# Patient Record
Sex: Male | Born: 1960
Health system: Southern US, Community
[De-identification: ages and names within clinical notes are randomized; demographics above are authoritative.]

## PROBLEM LIST (undated history)

## (undated) DIAGNOSIS — I519 Heart disease, unspecified: Secondary | ICD-10-CM

## (undated) DIAGNOSIS — I219 Acute myocardial infarction, unspecified: Secondary | ICD-10-CM

## (undated) DIAGNOSIS — I441 Atrioventricular block, second degree: Secondary | ICD-10-CM

## (undated) DIAGNOSIS — R0789 Other chest pain: Secondary | ICD-10-CM

## (undated) DIAGNOSIS — C801 Malignant (primary) neoplasm, unspecified: Secondary | ICD-10-CM

## (undated) DIAGNOSIS — Z86010 Personal history of colonic polyps: Principal | ICD-10-CM

## (undated) HISTORY — DX: Atrioventricular block, second degree: I44.1

## (undated) HISTORY — DX: Acute myocardial infarction, unspecified: I21.9

## (undated) HISTORY — DX: Other chest pain: R07.89

## (undated) HISTORY — PX: OTHER SURGICAL HISTORY: SHX169

## (undated) HISTORY — DX: Heart disease, unspecified: I51.9

## (undated) HISTORY — DX: Malignant (primary) neoplasm, unspecified: C80.1

## (undated) HISTORY — DX: Personal history of colonic polyps: Z86.010

---

## 1997-09-25 ENCOUNTER — Ambulatory Visit (HOSPITAL_COMMUNITY): Admission: RE | Admit: 1997-09-25 | Discharge: 1997-09-25 | Payer: Self-pay | Admitting: Urology

## 2002-05-24 ENCOUNTER — Emergency Department (HOSPITAL_COMMUNITY): Admission: EM | Admit: 2002-05-24 | Discharge: 2002-05-24 | Payer: Self-pay | Admitting: Emergency Medicine

## 2002-05-24 ENCOUNTER — Encounter: Payer: Self-pay | Admitting: Emergency Medicine

## 2010-04-09 ENCOUNTER — Encounter: Admission: RE | Admit: 2010-04-09 | Discharge: 2010-04-09 | Payer: Self-pay | Admitting: Orthopedic Surgery

## 2010-05-18 HISTORY — PX: CARDIAC CATHETERIZATION: SHX172

## 2010-07-06 ENCOUNTER — Inpatient Hospital Stay (HOSPITAL_COMMUNITY)
Admission: EM | Admit: 2010-07-06 | Discharge: 2010-07-10 | DRG: 125 | Disposition: A | Discharge status: Home / Self Care | Payer: BC Managed Care – PPO | Source: Ambulatory Visit | Attending: Cardiology | Admitting: Cardiology

## 2010-07-06 ENCOUNTER — Emergency Department (HOSPITAL_COMMUNITY): Payer: BC Managed Care – PPO

## 2010-07-06 DIAGNOSIS — R079 Chest pain, unspecified: Secondary | ICD-10-CM

## 2010-07-06 DIAGNOSIS — M719 Bursopathy, unspecified: Secondary | ICD-10-CM | POA: Diagnosis present

## 2010-07-06 DIAGNOSIS — R0789 Other chest pain: Principal | ICD-10-CM | POA: Diagnosis present

## 2010-07-06 DIAGNOSIS — Z87891 Personal history of nicotine dependence: Secondary | ICD-10-CM

## 2010-07-06 DIAGNOSIS — I498 Other specified cardiac arrhythmias: Secondary | ICD-10-CM | POA: Diagnosis not present

## 2010-07-06 DIAGNOSIS — I214 Non-ST elevation (NSTEMI) myocardial infarction: Secondary | ICD-10-CM

## 2010-07-06 DIAGNOSIS — I959 Hypotension, unspecified: Secondary | ICD-10-CM | POA: Diagnosis not present

## 2010-07-06 DIAGNOSIS — I219 Acute myocardial infarction, unspecified: Secondary | ICD-10-CM

## 2010-07-06 DIAGNOSIS — M67919 Unspecified disorder of synovium and tendon, unspecified shoulder: Secondary | ICD-10-CM | POA: Diagnosis present

## 2010-07-06 DIAGNOSIS — I441 Atrioventricular block, second degree: Secondary | ICD-10-CM | POA: Diagnosis present

## 2010-07-06 HISTORY — DX: Acute myocardial infarction, unspecified: I21.9

## 2010-07-06 LAB — URINALYSIS, ROUTINE W REFLEX MICROSCOPIC
Nitrite: NEGATIVE
Specific Gravity, Urine: 1.019 (ref 1.005–1.030)
Urine Glucose, Fasting: NEGATIVE mg/dL
pH: 7 (ref 5.0–8.0)

## 2010-07-06 LAB — LIPASE, BLOOD: Lipase: 35 U/L (ref 11–59)

## 2010-07-06 LAB — POCT CARDIAC MARKERS
CKMB, poc: <1 ng/mL — ABNORMAL LOW (ref 1.0–8.0)
Myoglobin, poc: 41 ng/mL (ref 12–200)
Troponin i, poc: 0.07 ng/mL (ref 0.00–0.09)

## 2010-07-06 LAB — COMPREHENSIVE METABOLIC PANEL
Albumin: 4.2 g/dL (ref 3.5–5.2)
BUN: 17 mg/dL (ref 6–23)
Calcium: 9.1 mg/dL (ref 8.4–10.5)
Glucose, Bld: 104 mg/dL — ABNORMAL HIGH (ref 70–99)
Sodium: 135 mEq/L (ref 135–145)
Total Protein: 7.1 g/dL (ref 6.0–8.3)

## 2010-07-06 LAB — CBC
HCT: 42.2 % (ref 39.0–52.0)
MCHC: 35.1 g/dL (ref 30.0–36.0)
MCV: 92.5 fL (ref 78.0–100.0)
Platelets: 294 10*3/uL (ref 150–400)
RDW: 11.9 % (ref 11.5–15.5)

## 2010-07-06 LAB — DIFFERENTIAL
Basophils Absolute: 0 10*3/uL (ref 0.0–0.1)
Basophils Relative: 1 % (ref 0–1)
Eosinophils Absolute: 0.1 10*3/uL (ref 0.0–0.7)
Eosinophils Relative: 1 % (ref 0–5)
Lymphocytes Relative: 16 % (ref 12–46)
Lymphs Abs: 1.3 10*3/uL (ref 0.7–4.0)
Monocytes Absolute: 0.8 10*3/uL (ref 0.1–1.0)
Monocytes Relative: 10 % (ref 3–12)
Neutro Abs: 5.8 10*3/uL (ref 1.7–7.7)
Neutrophils Relative %: 72 % (ref 43–77)

## 2010-07-06 LAB — TROPONIN I: Troponin I: 0.01 ng/mL (ref 0.00–0.06)

## 2010-07-06 LAB — CK TOTAL AND CKMB (NOT AT ARMC)
CK, MB: 5 ng/mL — ABNORMAL HIGH (ref 0.3–4.0)
Relative Index: 3.8 — ABNORMAL HIGH (ref 0.0–2.5)
Relative Index: 0.5 (ref 0.0–2.5)
Total CK: 130 U/L (ref 7–232)

## 2010-07-06 LAB — APTT: aPTT: 27 seconds (ref 24–37)

## 2010-07-07 LAB — PROTIME-INR: Prothrombin Time: 14.7 seconds (ref 11.6–15.2)

## 2010-07-07 LAB — BASIC METABOLIC PANEL
CO2: 24 mEq/L (ref 19–32)
Calcium: 8.4 mg/dL (ref 8.4–10.5)
Creatinine, Ser: 1.11 mg/dL (ref 0.4–1.5)
GFR calc Af Amer: >60 mL/min (ref >60)

## 2010-07-07 LAB — LIPID PANEL
LDL Cholesterol: 109 mg/dL — ABNORMAL HIGH (ref 0–99)
Total CHOL/HDL Ratio: 4.4 RATIO
Triglycerides: 77 mg/dL (ref <150)
VLDL: 15 mg/dL (ref 0–40)

## 2010-07-07 LAB — MRSA PCR SCREENING: MRSA by PCR: NEGATIVE

## 2010-07-07 LAB — HEPARIN LEVEL (UNFRACTIONATED): Heparin Unfractionated: 0.17 IU/mL — ABNORMAL LOW (ref 0.30–0.70)

## 2010-07-07 LAB — CBC
MCH: 32 pg (ref 26.0–34.0)
Platelets: 264 10*3/uL (ref 150–400)
RBC: 4.1 MIL/uL — ABNORMAL LOW (ref 4.22–5.81)
WBC: 7.1 10*3/uL (ref 4.0–10.5)

## 2010-07-07 LAB — POCT ACTIVATED CLOTTING TIME: Activated Clotting Time: 105 seconds

## 2010-07-07 LAB — CARDIAC PANEL(CRET KIN+CKTOT+MB+TROPI): Total CK: 112 U/L (ref 7–232)

## 2010-07-08 ENCOUNTER — Inpatient Hospital Stay (HOSPITAL_COMMUNITY): Payer: BC Managed Care – PPO

## 2010-07-08 DIAGNOSIS — R079 Chest pain, unspecified: Secondary | ICD-10-CM

## 2010-07-08 DIAGNOSIS — R072 Precordial pain: Secondary | ICD-10-CM

## 2010-07-08 LAB — CBC
HCT: 39.9 % (ref 39.0–52.0)
MCHC: 33.8 g/dL (ref 30.0–36.0)
MCV: 94.5 fL (ref 78.0–100.0)
RDW: 12.1 % (ref 11.5–15.5)

## 2010-07-08 LAB — COMPREHENSIVE METABOLIC PANEL
BUN: 8 mg/dL (ref 6–23)
Calcium: 9.2 mg/dL (ref 8.4–10.5)
Creatinine, Ser: 1.15 mg/dL (ref 0.4–1.5)
Glucose, Bld: 92 mg/dL (ref 70–99)
Total Protein: 6.2 g/dL (ref 6.0–8.3)

## 2010-07-09 ENCOUNTER — Inpatient Hospital Stay (HOSPITAL_COMMUNITY): Payer: BC Managed Care – PPO

## 2010-07-09 MED ORDER — TECHNETIUM TC 99M MEBROFENIN IV KIT
5.0000 | PACK | Freq: Once | INTRAVENOUS | Status: AC | PRN
  Administered 2010-07-09: 5 via INTRAVENOUS

## 2010-07-10 ENCOUNTER — Encounter: Payer: Self-pay | Admitting: Cardiology

## 2010-07-10 ENCOUNTER — Telehealth: Payer: Self-pay | Admitting: Cardiology

## 2010-07-15 NOTE — Progress Notes (Signed)
Summary: do you have forms from [employer]    Phone Note Call from Patient Call back at Home Phone [phone] Call back at cell # [phone]   Caller: Spouse Reason for Call: Talk to Nurse, Talk to Doctor Summary of Call: pt spouse wants to know if you received a form from [employer] yesterday that was faxed over to be completed pt being discharged today Initial call taken by: Omer Jack,  July 10, 2010 12:40 PM  Follow-up for Phone Call        left message that I have not recieved any paperwork to be filled out.  left our fax and phone numbers on her vm. Follow-up by: Charolotte Capuchin, RN,  July 10, 2010 12:52 PM

## 2010-07-18 ENCOUNTER — Telehealth: Payer: Self-pay | Admitting: Cardiology

## 2010-07-22 DIAGNOSIS — I441 Atrioventricular block, second degree: Secondary | ICD-10-CM | POA: Insufficient documentation

## 2010-07-22 DIAGNOSIS — R079 Chest pain, unspecified: Secondary | ICD-10-CM | POA: Insufficient documentation

## 2010-07-23 ENCOUNTER — Telehealth: Payer: Self-pay | Admitting: Cardiology

## 2010-07-23 ENCOUNTER — Encounter: Payer: Self-pay | Admitting: Physician Assistant

## 2010-07-23 ENCOUNTER — Encounter (INDEPENDENT_AMBULATORY_CARE_PROVIDER_SITE_OTHER): Payer: BC Managed Care – PPO | Admitting: Physician Assistant

## 2010-07-23 DIAGNOSIS — I441 Atrioventricular block, second degree: Secondary | ICD-10-CM | POA: Insufficient documentation

## 2010-07-23 DIAGNOSIS — R079 Chest pain, unspecified: Secondary | ICD-10-CM | POA: Insufficient documentation

## 2010-07-23 DIAGNOSIS — I519 Heart disease, unspecified: Secondary | ICD-10-CM

## 2010-07-23 NOTE — Assessment & Plan Note (Signed)
We had a long discussion regarding this.  He does not display any signs or symptoms of congestive heart failure.  He needs followup echocardiography in the future.  He would like to have this sooner rather than later.  I will set him up with a 2-D echocardiogram in 2 months and a followup appointment with Dr. Antoine Poche thereafter.  He can return to work now.  We will complete his paperwork for him.

## 2010-07-23 NOTE — Progress Notes (Signed)
History of Present Illness: Primary Cardiologist:  Dr. Rollene Rotunda  The patient is a 50 year old male who presented to [facility] 07/06/10 with chest pain.  His initial Troponin was elevated.  However, his subsequent troponins and CK-MBs were negative.  He had ongoing chest pain and was brought to the cardiac catheterization lab.  This demonstrated normal coronary arteries and normal LV function.  However, his ejection fraction on echocardiogram was mildly reduced at 40-45% with normal wall motion.  It was suspected that he possibly had athlete's heart.  Unfortunately, during a blood draw he suffered a severe episode.  His heart rate went down to the 30s and he required atropine therapy.  He was also noted to have transient Mobitz one during sleep.  Gastroenterology saw the patient.  Their workup was inconclusive.  His HIDA scan and abdominal ultrasound were both unremarkable.  There was a question of hether he had left shoulder bursitis.  He was placed on Mobic.  He returns for followup today.  According to the notes from the hospital, he is to have a followup echocardiogram in about 6 months.  The patient is here with his wife.  He has multiple questions about his hospital stay.  I spent a great deal of time explaining that the abnormal troponin was likely a lab error.  We talked about coronary vasospasm resulting in a myocardial infarction and how the cardiac enzymes would be elevated for quite some time.  We discussed his abnormal echocardiogram.  He will need a followup echocardiogram as noted above.  However, the patient is eager to have this repeated in the next couple of months.  He is still having some chest discomfort.  He states his symptoms are very similar to what his mother had before she had her gallbladder was removed.  He's not had any recurrent symptoms like what he had that brought him to the hospital.  He's taking Prevacid daily.  He denies orthopnea, PND or pedal edema.  He  denies syncope.  Past Medical History  Diagnosis Date  . Chest pain     Cardiac catheterization 07/07/10: Normal coronary arteries; EF 65%;                             GI workup inconclusive with a normal abdominal ultrasound and normal HIDA scan  . Mobitz (type) I (Wenckebach's) atrioventricular block     Documented during sleep during admission for chest pain 07/06/10 to 07/10/10  . Asymptomatic LV dysfunction     Echocardiogram 07/08/10: EF 40-45% with normal wall motion; question athlete's heart    Current Outpatient Prescriptions  Medication Sig Dispense Refill  . lansoprazole (PREVACID) 30 MG capsule Take 30 mg by mouth daily.          No Known Allergies  Vital Signs: BP 118/84  Pulse 76  Ht 6\' 1"  (1.854 m)  Wt 205 lb 6.4 oz (93.169 kg)  BMI 27.10 kg/m2  PHYSICAL EXAM: Well nourished, well developed, in no acute distress HEENT: normal Neck: no JVD Cardiac:  normal S1, S2; RRR; no murmur Lungs:  clear to auscultation bilaterally, no wheezing, rhonchi or rales Abd: soft, nontender, no hepatomegaly Ext: no edema; RFA site without hematoma or bruit Skin: warm and dry Neuro:  CNs 2-12 intact, no focal abnormalities noted  EKG: Normal sinus rhythm, heart rate 76, normal axis, J-point elevation, no significant change when compared to prior tracing.

## 2010-07-23 NOTE — Assessment & Plan Note (Signed)
This is noncardiac chest pain.  I had a long discussion with him and his wife regarding his hospital stay.  As noted, I believe the abnormal troponin was likely a lab error.  We believe that the episode of asystole was likely a vagal episode.  All of his other  cardiac enzymes were normal.  His GI workup was unremarkable.  However, I still think he should continue to follow with his primary care provider.  If he continues to have symptoms, he may want to pursue further workup with gastroenterology.

## 2010-07-24 NOTE — Progress Notes (Signed)
Summary: [employer] to be faxing form    have form now  Phone Note Call from Patient Call back at Home Phone [phone] Call back at [phone]   Caller: Spouse Reason for Call: Talk to Nurse, Talk to Doctor Summary of Call: just wanted to let you know that the company had not faxed the form yet but they will be faxing it Initial call taken by: Omer Jack,  July 10, 2010 4:42 PM  Follow-up for Phone Call        PT CALLING BACK REGARDING PAPER Judie Grieve  July 11, 2010 1:25 PM  pt was told at this time that I have not received any paperwork to be signed as of yet.  Avie Arenas, RN  I have recieved forms and have for Dr Antoine Poche to sign once completed.  PamFleming, RN  Pt calling back regarding paper work and [employer]  July 14, 2010 4:50 PM  Additional Follow-up for Phone Call Additional follow up Details #1::        paperwork has been completed signed and faxed (by medical records) Additional Follow-up by: Charolotte Capuchin, RN,  July 17, 2010 4:59 PM

## 2010-07-24 NOTE — Progress Notes (Signed)
Summary: Pt request call   called  Phone Note Call from Patient Call back at Home Phone [phone]   Caller: Mom Summary of Call: Pt have question request call Initial call taken by: Judie Grieve,  July 18, 2010 3:23 PM  Follow-up for Phone Call        pts wife calling stating that they received notification from insurance company wanting to know when pt can return to work.  I told wife that I had already spoke with them today and let them know that Dr Antoine Poche will see the pt on 3/7 and he will decide then.  Most likely the pt will be released that day.  Wife states understanding. Follow-up by: Charolotte Capuchin, RN,  July 18, 2010 3:44 PM

## 2010-07-24 NOTE — Progress Notes (Signed)
Summary: need date pt can return to work  Phone Note Other Incoming   Caller: [employer]/ [phone] ext [phone] Rajna Summary of Call: Need date  the pt can return to work Initial call taken by: Judie Grieve,  July 18, 2010 10:53 AM  Follow-up for Phone Call        pt has appt Wed 07/23/2010 and should be released that day.  Ranja aware Follow-up by: Charolotte Capuchin, RN,  July 18, 2010 11:02 AM

## 2010-07-24 NOTE — Letter (Signed)
Summary: [name] Herman   Imported By: Marylou Mccoy 07/17/2010 15:44:02  _____________________________________________________________________  External Attachment:    Type:   Image     Comment:   External Document

## 2010-07-25 ENCOUNTER — Telehealth: Payer: Self-pay | Admitting: Cardiology

## 2010-07-29 NOTE — Progress Notes (Signed)
Summary: return to work letter  Phone Note Call from Patient Call back at [facility] Phone [phone]   Caller: Patient Reason for Call: Talk to Nurse Summary of Call: pt states he needs a return to work letter fax# [phone] Rockne Menghini  Initial call taken by: Roe Coombs,  July 23, 2010 3:44 PM  Follow-up for Phone Call       Follow-up by: Charolotte Capuchin, RN,  July 23, 2010 4:12 PM

## 2010-07-29 NOTE — Assessment & Plan Note (Signed)
Summary: Please see noted done in [facility] that is scanned in.   Visit Type:  eph Primary Provider:  Evelena Peat  CC:  chest pain LUQ......cold fingers/blue....fatigue....  History of Present Illness: Please see noted done in [facility] that is scanned in.   Current Medications (verified): 1)  Prevacid 30 Mg Cpdr (Lansoprazole) .Marland Kitchen.. 1 Tab Qam...  Allergies (verified): No Known Drug Allergies  Vital Signs:  Patient profile:   50 year old male Height:      73 inches Weight:      205.25 pounds BMI:     27.18 Pulse rate:   76 / minute Pulse rhythm:   regular BP sitting:   118 / 84  (left arm) Cuff size:   large  Vitals Entered By: Danielle Rankin, CMA (July 23, 2010 10:03 AM)   Other Orders: EKG w/ Interpretation (93000)  Patient Instructions: 1)  Your physician has requested that you have an echocardiogram 09/22/10 @ 10:30 per Tereso Newcomer, PA-C.  Echocardiography is a painless test that uses sound waves to create images of your heart. It provides your doctor with information about the size and shape of your heart and how well your heart's chambers and valves are working.  This procedure takes approximately one hour. There are no restrictions for this procedure. 2)  Your physician recommends that you schedule a follow-up appointment in: 1 week after your Echo to see Dr. Antoine Poche as per Tereso Newcomer, PA-C. 3)  Your physician recommends that you continue on your current medications as directed. Please refer to the Current Medication list given to you today.

## 2010-07-31 ENCOUNTER — Ambulatory Visit (INDEPENDENT_AMBULATORY_CARE_PROVIDER_SITE_OTHER): Payer: BC Managed Care – PPO | Admitting: Family Medicine

## 2010-07-31 ENCOUNTER — Encounter: Payer: Self-pay | Admitting: Family Medicine

## 2010-07-31 DIAGNOSIS — R079 Chest pain, unspecified: Secondary | ICD-10-CM

## 2010-07-31 DIAGNOSIS — I519 Heart disease, unspecified: Secondary | ICD-10-CM

## 2010-07-31 DIAGNOSIS — I441 Atrioventricular block, second degree: Secondary | ICD-10-CM

## 2010-07-31 NOTE — Progress Notes (Signed)
  Subjective:    Patient ID: [name] Herman, male    DOB: 12/17/1960, 50 y.o.   MRN: [id number]  HPI  Patient is seen to establish care. Recent admission for chest pain. He initially had one creatinine kinase MB. Fraction of 5.0 slightly elevated with troponin 0.79. Subsequent cardiac enzymes negative. Cardiac catheterization revealed normal coronary arteries with normal LV function. Patient had echocardiogram which showed ejection fraction 40-45% with normal wall thickness. No evidence for diastolic dysfunction. Patient had episode the morning after admission about 10 minutes after blood draw of bradycardia with reported heart rate of 30 and near syncope but no true syncope.  Code was called. Patient was given atropine with prompt improvement in heart rate.    Patient has scheduled followup echocardiogram in May and scheduled followup at [facility] with cardiac specialist next week. He had no chest pain since discharge. Had gastrointestinal workup with ultrasound and gallbladder nuclear scan which were normal.    Patient has not had any syncope or presyncope since discharge. He exercises fairly regularly though not since discharge. 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 No history of hypertension. Low HDL of 36 but overall cholesterol 160. No family history of premature coronary disease. Nonsmoker.   Review of Systems  Constitutional: Negative for fever, chills and unexpected weight change.  HENT: Negative for neck pain.   Eyes: Negative for visual disturbance.  Respiratory: Negative for cough, shortness of breath and wheezing.   Cardiovascular: Negative for palpitations and leg swelling.  Gastrointestinal: Negative for nausea, vomiting, abdominal pain, diarrhea, blood in stool and abdominal distention.  Neurological: Negative for syncope and headaches.  Hematological: Negative for adenopathy.       Objective:   Physical  Exam  patient is alert and healthy in appearance.  Oropharynx clear Neck supple no mass Chest clear to auscultation Heart regular rhythm and rate with no murmur Abdomen soft nontender Extremities no edema       Assessment & Plan:   #1 Recent  chest pain with one CK MB subfraction minimally elevated with subsequent enzymes negative. Question of coronary vasospasm  #2 probable vasovagal episode during hospitalization  #3 question of depressed left ventricular systolic function by echo  #4 reported Mobitz type I block while sleeping  Patient is encourage to follow through with cardiology appt next week.  We will extend his time out of work until next week.

## 2010-07-31 NOTE — Discharge Summary (Signed)
[name] Herman, [name] Herman NO.:  [id number]  MEDICAL RECORD NO.:  [id number]           PATIENT TYPE:  I  LOCATION:  3738                         FACILITY:  [facility]  PHYSICIAN:  Rollene Rotunda, MD, FACCDATE OF BIRTH:  1961/01/03  DATE OF ADMISSION:  07/06/2010 DATE OF DISCHARGE:  07/10/2010                              DISCHARGE SUMMARY   PRIMARY CARDIOLOGIST:  Rollene Rotunda, MD, [facility]  PRIMARY CARE PHYSICIAN:  None.  CONSULTING GASTROENTEROLOGIST:  Rachael Fee, MD  DISCHARGE DIAGNOSES: 1. Noncardiac chest pain.     a.     Initial cardiac enzymes positive at CK 130, MB 5.0, relative      index 3.8, and troponin 0.79 with previous point-of-care markers      negative and two followup sets of enzymes negative.     b.     Cardiac catheterization, July 07, 2010:  Normal      coronaries.  Normal left ventricular function.     c.     Gastrointestinal consult and workup including HIDA scan      without clear evidence of a gastrointestinal etiology.     d.     Question left shoulder bursitis. 2. Depressed left ventricular systolic function, ejection fraction 40%     to 45% (? athlete's heart).     a.     A 2-D echocardiogram, July 08, 2010:  Left ventricular      cavity size normal, wall thickness normal, systolic function mild      to moderately reduced, left ventricular ejection fraction 40% to      45% with normal wall motion, and no evidence of diastolic      dysfunction. 3. Mobitz type 1 (while sleeping). 4. Vagal episode (bradycardia/hypotension).     a.     The patient had presyncopal event after getting blood drawn,      heart rate approximately 30 bpm, blood pressure 91/48, oxygen      saturation 83%.  No frank syncope.  SECONDARY DIAGNOSES:  None.  ALLERGIES:  NKDA.  PROCEDURES: 1. EKG, July 06, 2010:  NSR, 88 bmp, no acute ST-T-wave changes,     axis and intervals within normal limits. 2. Chest x-ray, July 06, 2010:  No active  disease. 3. Cardiac catheterization, July 07, 2010:  Normal coronaries.     Normal LV function, LVEF 65% with normal wall motion. 4. A 2-D echocardiogram, July 08, 2010:  LV cavity size normal,     wall thickness normal, mild to moderately reduced LVEF at 40% to     45% with normal wall motion and no evidence of diastolic     dysfunction (? athlete's Heart). 5. Ultrasound of the abdomen, July 08, 2010.  Negative abdominal     ultrasound. 6. HIDA scan, July 09, 2010:  Within normal limits.  HISTORY OF PRESENT ILLNESS:  [name] Herman is a 50 year old gentleman with no known cardiac history or any other medical history really who presented to [facility] with 4-5 days intermittent chest discomfort, worst on morning of presentation after he had already awakened.  The patient reported no  clear aggravating or relieving factors, but that his symptoms were better on the day prior to his presentation, but unfortunately after he woke up and was getting ready to go to church, his symptoms were more intense than they had been before and unlike prior GI symptoms.  Discomfort across his chest but also points to localized discomfort under his left breast.  He then became concerned as his left upper extremity started to have discomfort and then had numbness in both hands.  He had some nausea and diaphoresis as well and per wife report looked clammy.  After eval in the emergency department, he was noted to have an initial troponin of 0.79 as well as a significantly elevated MB relative to CK and received aspirin, sublingual nitroglycerin (no significant change in symptoms), started on heparin drip, and then saw significant improvement in symptoms.  He was subsequently put on IV nitroglycerin, and when was seen by Cardiology still was having some mild discomfort only.  The patient reported no exertional component to his symptoms and has previously been active, working out  regularly.  HOSPITAL COURSE:  The patient was admitted and followup enzymes previously were totally within normal limits.  The patient had what appears to be a vagal episode after having some blood drawn early in the morning following his admission with heart rate noted to be just slightly less than 30 bpm and significant presyncope and hypotension with systolic blood pressure at 91/48.  The patient also had O2 sat noted at 83%.  He was given 0.5 mg of atropine IV.  All of this was made apparent to Dr. Antoine Herman who later determined that the patient likely had a vagal episode after blood draw.  The patient had no recurrent symptoms similar to this during his hospital course.  The patient did undergo scheduled cardiac catheterization later that morning which luckily showed normal systolic function and normal coronaries. Secondary to these findings, he received a GI consult including ultrasound of the abdomen and a HIDA scan, both which did not show any concerning findings.  The patient was noted overnight to have Mobitz type 1 but no ominous arrhythmias.  A 2-D echocardiogram completed which previously showed depressed LVEF but no wall motion abnormalities or other abnormalities and this is thought to be possibly "athlete's heart."  Followup 2-D echocardiogram will be completed in 6 months. After GI eval completed on July 10, 2010, the patient was deemed stable for discharge by his primary cardiologist, Dr. Rollene Rotunda. The patient will have followup with Dr. Antoine Herman in 2-3 weeks and will be instructed to obtain a primary care physician for a noncardiac followup.  At the time of discharge, the patient received his medication list, prescriptions, followup instructions, and all questions and concerns were addressed prior to his leaving the hospital.  DISCHARGE LABORATORY DATA:  WBC 8.0, HGB 13.5, HCT 39.9, PLT count is 268,000.  WBC differential on admission was within normal  limits. Protime is 14.7, INR 1.13.  Sodium 143, potassium 4.2, chloride 107, bicarb 28, BUN is 8, creatinine 1.15, glucose 72.  Liver function tests within normal limits except for alk phos low at 33, albumin 3.7, calcium 9.2.  Lipase 35.  Initial set of point-of-care markers negative.  First full set of enzymes, CK 130, MB 5.0, relative index 3.8, troponin-I 0.79.  Second set and third set both well within normal limits.  Total cholesterol 160, triglyceride 77, HDL 36, LDL 109.  Total cholesterol/HDL ratio 4.4.  TSH 2.415.  Urinalysis within  normal limits.  FOLLOWUP PLANS AND APPOINTMENTS: 1. Dr. Rollene Rotunda, [facility] office, 2-3 weeks     (to be scheduled prior to the patient's discharge). 2. Dr. Rob Bunting p.r.n. per the patient. 3. Primary care provider (the patient instructed to obtain PCP and     schedule appointment in the next 2-3 weeks.  DISCHARGE LABORATORY DATA: 1. Acetaminophen 500 mg 1 tablet p.o. q.6 h. p.r.n. 2. Pantoprazole 40 mg 1 tablet p.o. daily p.r.n. 3. Ibuprofen 800 mg q.8 h. p.r.n. (do not take within 8 hours Mobic). 4. Mobic 15 mg 1 tablet p.o. daily (do not take within 8 hours of     ibuprofen).  Duration of discharge encounter including physician time was 45 minutes.     Jarrett Ables, PAC   ______________________________ Rollene Rotunda, MD, [facility]    MS/MEDQ  D:  07/10/2010  T:  07/10/2010  Job:  [id number]  cc:   Rachael Fee, MD  Electronically Signed by Jarrett Ables PAC on 07/14/2010 02:55:00 PM Electronically Signed by Rollene Rotunda MD [facility] on 07/31/2010 07:54:43 PM

## 2010-07-31 NOTE — Patient Instructions (Signed)
Consider scheduling complete physical at some point later this year.

## 2010-07-31 NOTE — H&P (Signed)
[name] Herman Herman, [name] Herman NO.:  [id number]  MEDICAL RECORD NO.:  [id number]           PATIENT TYPE:  I  LOCATION:  1858                         FACILITY:  [facility]  PHYSICIAN:  [name] Rotunda, MD, FACCDATE OF BIRTH:  1960/09/11  DATE OF ADMISSION:  07/06/2010 DATE OF DISCHARGE:                             HISTORY & PHYSICAL   PRIMARY CARE PHYSICIAN:  None.  CARDIOLOGIST:  None.  REASON FOR PRESENTATION:  Evaluate the patient with chest pain.  HISTORY OF PRESENT ILLNESS:  The patient is a pleasant 50 year old white gentleman without prior cardiac history.  He reports that for about 4 or 5 days he has been having chest discomfort.  He says this has really been persistent through the day and at times it has been intense.  It would come on at rest with emotional stress.  It was not exacerbated by position or deep breathing.  Seemed to wax and wane.  He was able to ignore it.  Actually yesterday was somewhat better.  However, this morning when he woke up and was getting ready for church the discomfort was at its most intense, it was unlike previous GI symptoms.  It was across his chest.  There is also a point of localized discomfort under his left breast.  He became concerned when he had left arm discomfort. He actually had numbness in both of his hands.  He did have some nausea and diaphoresis.  His wife said he looked clammy.  He presented to the emergency room where he did not have any acute EKG changes.  However, his cardiac enzymes did demonstrate a troponin of 0.79.  CK was 130 with an MB of 5.0.  He received aspirin.  Sublingual nitroglycerin did not significantly improve his symptoms.  He has been started on heparin and IV nitroglycerin.  He currently has some mild discomfort.  He otherwise has been an active gentleman.  He has worked routinely at [employer]  He has not done any significant aerobic activity in several days, however.  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  He denies any associated symptoms such as palpitations, presyncope, or syncope.  He has had no PND or orthopnea.  He has had no weight gain or edema.  He has had no cough, fevers, or chills.  PAST MEDICAL HISTORY:  None (however, the patient does not really see a physician).  PAST SURGICAL HISTORY:  None.  ALLERGIES:  None.  MEDICATIONS:  Mobic recently.  SOCIAL HISTORY:  The patient is married.  He is a Teacher, early years/pre.  He has a 14 year old son who just started driving and an 28 year old daughter. He has not smoked since high school.  FAMILY HISTORY:  Noncontributory for early coronary artery disease.  REVIEW OF SYSTEMS:  Positive for some shoulder discomfort.  Otherwise as stated in the HPI negative for all other systems.  PHYSICAL EXAMINATION:  GENERAL:  The patient is well-appearing and in no distress. VITAL SIGNS:  Blood pressure 160/90, heart 86 and regular, afebrile, and respiratory rate 16. HEENT:  Eyes are unremarkable, pupils equal, round, and reactive to light, fundi not visualized, oral mucosa unremarkable. NECK:  No jugular distention at 45 degrees, carotid upstroke brisk and symmetrical, no bruits, no thyromegaly. LYMPHATICS:  No cervical, axillary, or inguinal adenopathy. LUNGS:  Clear to auscultation bilaterally. BACK:  No costovertebral angle tenderness. CHEST:  Unremarkable. HEART:  PMI not displaced or sustained, S1-S2 within normal limits, no S3, no S4, no clicks, no rubs, no murmurs. ABDOMEN:  Flat, positive bowel sounds, normal in frequency and pitch, no bruits, no rebound, no guarding, no midline pulsatile mass, no hepatomegaly, no splenomegaly. SKIN:  No rashes, no nodules. EXTREMITIES:  2+ pulses throughout, no edema, no cyanosis, no clubbing. NEURO:  Oriented to person, place, and time,.  Cranial nerves II through XII are grossly intact, motor grossly intact.  LABORATORY  DATA:  WBC 8.0, hemoglobin 14.8, platelets 294, sodium 135, potassium 4.2, BUN 17, and creatinine 1.13.  EKG, sinus rhythm, rate 88, axis within normal limits, intervals within normal limits, no acute ST-T wave changes.  Chest x-ray, no disease.  ASSESSMENT AND PLAN: 1. Non-Q-wave myocardial infarction.  The patient has a non-Q-wave     myocardial infarction and some mild ongoing discomfort.  He does     not have acute EKG changes.  At this point I will treat him with IV     nitroglycerin, heparin, beta-blockers, and aspirin.  I will give     him a low dose of morphine for pain control.  I will also give him     Plavix.  I anticipate elective cardiac catheterization unless he     has EKG changes for ongoing symptoms. 2. Risk reduction.  I will check a lipid profile and empirically start     Lipitor. 3. Stress.  The patient reports high stress job.  We will discuss this     further counseling.     [name] Rotunda, MD, [facility]     JH/MEDQ  D:  07/06/2010  T:  07/06/2010  Job:  [id number]  Electronically Signed by [name] Rotunda MD [facility] on 07/31/2010 07:54:46 PM

## 2010-07-31 NOTE — Procedures (Signed)
  NAMEDALTIN, CRIST NO.:  [id number]  MEDICAL RECORD NO.:  [id number]           PATIENT TYPE:  LOCATION:                                 FACILITY:  PHYSICIAN:  Rollene Rotunda, MD, FACCDATE OF BIRTH:  11-22-1960  DATE OF PROCEDURE: DATE OF DISCHARGE:                           CARDIAC CATHETERIZATION   PRIMARY:  None.  CARDIOLOGIST:  Rollene Rotunda, MD, [facility]  PROCEDURE:  Left heart catheterization/coronary arteriography.  INDICATIONS:  Evaluate the patient with chest pain and elevated cardiac marker with a troponin initially of 0.79.  PROCEDURE NOTE:  Left heart catheterization was performed via the right femoral artery.  The artery was cannulated using anterior wall puncture. A #5-French arterial sheath was inserted via the modified Seldinger technique.  Preformed Judkins and a pigtail catheter were utilized.  The patient tolerated the procedure well and left the lab in stable condition.  RESULTS:  Hemodynamics:  LV 104/11, AO 104/71.  Coronaries:  Left main was normal.  The LAD was large and normal.  First diagonal was large and normal.  Second diagonal was small and normal.  Circumflex in the AV groove was normal.  First obtuse marginal was small and normal.  Second obtuse marginal was large and normal.  The right coronary artery was dominant vessel.  It was normal throughout its course.  The PDA was small to moderate sized and normal.  Left ventriculogram:  The left ventriculogram was obtained in the RAO projection.  The EF was 65% with normal wall motion.  CONCLUSION:  Normal coronaries.  Normal left ventricular function.  PLAN:  The patient will have no further cardiac workup.  I will consult GI as he continues to have 6/10 chest discomfort.     Rollene Rotunda, MD, [facility]     JH/MEDQ  D:  07/07/2010  T:  07/07/2010  Job:  [id number]  Electronically Signed by Rollene Rotunda MD [facility] on 07/31/2010 07:54:52 PM

## 2010-08-01 ENCOUNTER — Telehealth: Payer: Self-pay | Admitting: *Deleted

## 2010-08-01 ENCOUNTER — Encounter: Payer: Self-pay | Admitting: *Deleted

## 2010-08-01 NOTE — Telephone Encounter (Signed)
Patient would like for Harriett Sine to call him concerning forms to be faxed for insurance.

## 2010-08-01 NOTE — Telephone Encounter (Signed)
I spoke with pt, he requested a note be sent to [employer] to inform them he had an appt with Dr Caryl Never on 07/31/2010.  This letter was done, faxed, confirmation received.  Pt also calling to remind Dr Caryl Never of a form given to him at [facility] to fill out for his disability until he can see the specialist.

## 2010-08-05 NOTE — Progress Notes (Signed)
Summary: some question regarding office visit  Phone Note Call from Patient Call back at Home Phone [phone]   Caller: Patient Reason for Call: Talk to Nurse Summary of Call: pt saw sw on 3/7,. has some questions.  Initial call taken by: Lorne Skeens,  July 25, 2010 9:24 AM  Follow-up for Phone Call        Phone Call Completed SPOKE WITH PT  WOULD LIKE TO REMAIN OUT OF WORK FOR 1 MORE WEEK ATTEMPTED TO WORK YESTERDAY AND FEELS BAD TODAY  B/P WAS 150/91 AND HR RANGING ANY WHERE FROM 97-108 AT REST  PT TO HAVE FORM FOR WORK FAXED TO OFFICE WILL HAVE SOTT WEAVER PAC FILL OUT  AND SEND BACK TO EMPLOYER HAS F/U APPT AT [place] OFF ON THURSDAY. Follow-up by: Scherrie Bateman, LPN,  July 25, 2010 9:37 AM  Additional Follow-up for Phone Call Additional follow up Details #1::        Left two messages.  I think all of the paperwork is complete. Additional Follow-up by: Rollene Rotunda, MD, [facility],  July 28, 2010 3:08 PM

## 2010-08-11 ENCOUNTER — Telehealth: Payer: Self-pay | Admitting: Cardiology

## 2010-08-11 NOTE — Telephone Encounter (Signed)
Dr. Antoine Poche extended patient's rtn to work date and he needs the letter that was typed sent to his work. Please call patient to get work information because he wants to know if we can fax it to his work

## 2010-08-11 NOTE — Telephone Encounter (Signed)
Patient called regarding a letter typed and send to employer for patient to be off from work an extra week . I let pt. know  Could not find a letter on his records. The form [id number] was filled out and faxed to his employer according to a note written on 07/25/10 by Jilda Panda. Pt. Aware. He states if his employer needs something else, he will call the office back.

## 2010-09-22 ENCOUNTER — Other Ambulatory Visit (HOSPITAL_COMMUNITY): Payer: BC Managed Care – PPO | Admitting: Radiology

## 2010-11-11 ENCOUNTER — Telehealth: Payer: Self-pay

## 2010-11-11 NOTE — Telephone Encounter (Signed)
Patient called requesting a Rx refill for protonix 40 mg (90 day supply) to be sent to [facility] pharmacy. Patient also stated that he will be going on a boat trip soon and would like a Rx sent in to help with nausea while boating. Please advise Thanks

## 2010-11-11 NOTE — Telephone Encounter (Signed)
OK to refill protonix for one year. Would prescribe scopolamine patch one patch behind ear every 3 days prn motion sickness-disp one box of patches.

## 2010-11-12 MED ORDER — PANTOPRAZOLE SODIUM 40 MG PO TBEC: 40.0000 mg | DELAYED_RELEASE_TABLET | Freq: Every day | ORAL | Status: DC

## 2010-11-12 MED ORDER — SCOPOLAMINE 1 MG/3DAYS TD PT72: 1.0000 | MEDICATED_PATCH | TRANSDERMAL | Status: DC

## 2010-12-17 ENCOUNTER — Other Ambulatory Visit (HOSPITAL_COMMUNITY): Payer: BC Managed Care – PPO | Admitting: Radiology

## 2010-12-24 ENCOUNTER — Other Ambulatory Visit (HOSPITAL_COMMUNITY): Payer: Self-pay | Admitting: Radiology

## 2010-12-25 ENCOUNTER — Ambulatory Visit (HOSPITAL_COMMUNITY): Payer: BC Managed Care – PPO | Attending: Cardiology | Admitting: Radiology

## 2010-12-25 DIAGNOSIS — I252 Old myocardial infarction: Secondary | ICD-10-CM | POA: Insufficient documentation

## 2010-12-25 DIAGNOSIS — R072 Precordial pain: Secondary | ICD-10-CM

## 2010-12-25 DIAGNOSIS — I519 Heart disease, unspecified: Secondary | ICD-10-CM | POA: Insufficient documentation

## 2010-12-25 DIAGNOSIS — I079 Rheumatic tricuspid valve disease, unspecified: Secondary | ICD-10-CM | POA: Insufficient documentation

## 2010-12-25 DIAGNOSIS — I0989 Other specified rheumatic heart diseases: Secondary | ICD-10-CM | POA: Insufficient documentation

## 2010-12-25 DIAGNOSIS — R079 Chest pain, unspecified: Secondary | ICD-10-CM | POA: Insufficient documentation

## 2010-12-25 DIAGNOSIS — I441 Atrioventricular block, second degree: Secondary | ICD-10-CM | POA: Insufficient documentation

## 2010-12-25 DIAGNOSIS — Z87891 Personal history of nicotine dependence: Secondary | ICD-10-CM | POA: Insufficient documentation

## 2011-01-16 ENCOUNTER — Telehealth: Payer: Self-pay | Admitting: Cardiology

## 2011-01-16 NOTE — Telephone Encounter (Signed)
Pt aware of results of echo 

## 2011-01-16 NOTE — Telephone Encounter (Signed)
Pt calling for results of test he had about two weeks ago

## 2011-01-16 NOTE — Telephone Encounter (Signed)
Echo - EF higher than previous, low normal.  No change in treatment.

## 2011-03-24 ENCOUNTER — Telehealth: Payer: Self-pay | Admitting: Cardiology

## 2011-03-24 NOTE — Telephone Encounter (Signed)
Per pt - states he feels like he may have over did it this weekend working in the yard.  He felt tired and weak.  He reports his wife said he was pale.  His BP today was 150/86.  He has occasional discomfort under his left shoulder blade.  He denies and chest pain, n/v and or SOB.  Pt is scheduled for an appt tomorrow with Tereso Newcomer.  He is instructed to call back or report to the ED if s/s become more pronounced.  He states understanding.

## 2011-03-24 NOTE — Telephone Encounter (Signed)
Pt needs to talk to someone regarding symptoms he is not feeling well and his b/p is elevated. Pt wants to talk to someone today

## 2011-03-25 ENCOUNTER — Ambulatory Visit (INDEPENDENT_AMBULATORY_CARE_PROVIDER_SITE_OTHER): Payer: 59 | Admitting: Physician Assistant

## 2011-03-25 ENCOUNTER — Encounter: Payer: Self-pay | Admitting: Physician Assistant

## 2011-03-25 VITALS — BP 122/82 | HR 71 | Ht 72.0 in | Wt 209.8 lb

## 2011-03-25 DIAGNOSIS — R079 Chest pain, unspecified: Secondary | ICD-10-CM

## 2011-03-25 NOTE — Patient Instructions (Addendum)
Your physician recommends that you schedule a follow-up appointment in: AS NEEDED PER SCOTT WEAVER, PA-C  Your physician recommends that you return for lab work in: TODAY D-DIMER 786.50 CHEST PAIN  TAKE PROTONIX 40 MG 1 CAP TWICE DAILY X 2 WEEKS THEN RESUME 1 CAP DAILY PER SCOTT WEAVER, PA-C

## 2011-03-25 NOTE — Assessment & Plan Note (Addendum)
Atypical.  He did travel to [place] recently.  Symptoms would be very atypical for pulmonary embolus.  He and his wife were very concerned about his symptoms due to his presentation in 2/12.  He has not really felt that well since this started a few days ago.  I will get a DDimer today.  If negative, no further workup.  If abnormal, get a V/Q scan.  Otherwise, increase Protonix to BID for a week, then resume QD.  I suspect this is all GI related.  If he has recurrent symptoms in the future, consider referring to GI.  Of note, his BPs were somewhat high over the weekend.  I suspect this was related to the pain and concern over his symptoms.  BP looks good today.

## 2011-03-25 NOTE — Progress Notes (Signed)
History of Present Illness: Primary Cardiologist:  Dr. Rollene Rotunda   [name] Herman is a 50 y.o. male who presented to [facility] 02/12 with chest pain.  Initial TnI was elevated. However, subsequent troponins and CK-MBs were all negative. He had ongoing chest pain and LHC demonstrated normal coronary arteries and normal LV function.  However, his ejection fraction on echocardiogram was mildly reduced at 40-45% with normal wall motion. It was suspected that he possibly had athlete's heart. Unfortunately, during a blood draw he suffered a severe bradycardic episode with heart rate dropping into the 30s and he required atropine therapy. He was also noted to have transient Mobitz 1 during sleep. GI workpu was inconclusive.  Follow up echo in 8/12: EF 50-55%, grade 1 diast dysfxn.  No further cardiac evaluation was planned.    Presents today with chest pain.  It occurred this past weekend.  Ate heavy meal with steak the night before.  Notes a lot of belching.  No dysphagia or odynophagia.  Takes Protonix daily.  He works out several times a week without chest pain or dyspnea.  No syncope.  No pleuritic chest pain.  No orthopnea or PND.  No edema.  No palpitations. He notes the pain was left sided.  It did not radiate anywhere.  No associated symptoms.  Pain was fairly constant and resolved after a couple days.  No change with position changes.    Past Medical History  Diagnosis Date  . Chest pain     Cardiac catheterization 07/07/10: Normal coronary arteries; EF 65%;                             GI workup inconclusive with a normal abdominal ultrasound and normal HIDA scan  . Mobitz (type) I (Wenckebach's) atrioventricular block     Documented during sleep during admission for chest pain 07/06/10 to 07/10/10  . Asymptomatic LV dysfunction     Echocardiogram 07/08/10: EF 40-45% with normal wall motion; question athlete's heart  . Myocardial infarction 07-06-2010    Current Outpatient  Prescriptions  Medication Sig Dispense Refill  . pantoprazole (PROTONIX) 40 MG tablet Take 1 tablet (40 mg total) by mouth daily.  90 tablet  2    Allergies: No Known Allergies  History  Substance Use Topics  . Smoking status: Former Smoker -- 0.5 packs/day for 3 years    Types: Cigarettes    Quit date: 07/30/1980  . Smokeless tobacco: Not on file  . Alcohol Use: Not on file    ROS:  Please see the history of present illness.  No melena, hematochezia, diarrhea, vomiting, weight loss.   All other systems reviewed and negative.   Vital Signs: BP 122/82  Pulse 71  Ht 6' (1.829 m)  Wt 209 lb 12.8 oz (95.165 kg)  BMI 28.45 kg/m2  PHYSICAL EXAM: Well nourished, well developed, in no acute distress HEENT: normal Neck: no JVD Cardiac:  normal S1, S2; RRR; no murmur Chest: no tenderness to palpation Lungs:  clear to auscultation bilaterally, no wheezing, rhonchi or rales Abd: soft, nontender, no hepatomegaly Ext: no edema Skin: warm and dry Neuro:  CNs 2-12 intact, no focal abnormalities noted  EKG:  NSR, HR 71, normal axis, PAC, no ischemic changes.  ASSESSMENT AND PLAN:

## 2011-05-08 ENCOUNTER — Telehealth: Payer: Self-pay | Admitting: *Deleted

## 2011-05-08 NOTE — Telephone Encounter (Signed)
Fax from [employer], patient requesting a switch from pantoprazole 40 to Nexium, co-pay at [facility] is $0

## 2011-05-08 NOTE — Telephone Encounter (Signed)
OK to change to Nexium 40 mg daily and refill #90 with 3 refill.

## 2011-05-11 MED ORDER — ESOMEPRAZOLE MAGNESIUM 40 MG PO CPDR: 40.0000 mg | DELAYED_RELEASE_CAPSULE | Freq: Every day | ORAL | Status: DC

## 2011-08-27 ENCOUNTER — Ambulatory Visit (INDEPENDENT_AMBULATORY_CARE_PROVIDER_SITE_OTHER): Payer: 59 | Admitting: Sports Medicine

## 2011-08-27 VITALS — BP 130/80

## 2011-08-27 DIAGNOSIS — M7750 Other enthesopathy of unspecified foot: Secondary | ICD-10-CM

## 2011-08-27 DIAGNOSIS — M7670 Peroneal tendinitis, unspecified leg: Secondary | ICD-10-CM | POA: Insufficient documentation

## 2011-08-27 MED ORDER — MELOXICAM 15 MG PO TABS: 15.0000 mg | ORAL_TABLET | Freq: Every day | ORAL | Status: DC

## 2011-08-27 NOTE — Patient Instructions (Signed)
Ankle strengthening exercises Ice massage 20 min bid Mobic 15 mg po qd x 2 weeks. Heel lift with lateral wedges to stop supination. F/U in 4 weeks

## 2011-08-27 NOTE — Progress Notes (Signed)
  Subjective:    Patient ID: [name] Herman, male    DOB: 07-13-1960, 51 y.o.   MRN: [id number]  HPI [name] Herman is a pleasant 51yo male patient complaining of right lateral foot pain for the last month no MOI, dull ache, on and off worse with activities, no swelling no hematomas.No numbness or tingling. He changed running shoes to a minimalist shoes with less heel, after that the pain started.  Patient Active Problem List  Diagnoses  . Chest pain  . Mobitz (type) I (Wenckebach's) atrioventricular block  . Asymptomatic LV dysfunction  . MYOCARDIAL INFARCTION  . ATRIOVENTRICULAR BLOCK, MOBITZ TYPE I  . CHEST PAIN   Current Outpatient Prescriptions on File Prior to Visit  Medication Sig Dispense Refill  . esomeprazole (NEXIUM) 40 MG capsule Take 1 capsule (40 mg total) by mouth daily.  90 capsule  3  . pantoprazole (PROTONIX) 40 MG tablet Take 1 tablet (40 mg total) by mouth daily.  90 tablet  2   No Known Allergies    Review of Systems  Constitutional: Negative for fever, chills, diaphoresis and fatigue.  Musculoskeletal: Negative for back pain, joint swelling, arthralgias and gait problem.  Neurological: Negative for tremors, weakness and numbness.       Objective:   Physical Exam  Constitutional: He appears well-developed and well-nourished.       BP 130/80   Pulmonary/Chest: Effort normal.  Musculoskeletal:       R ankle with a intact skin. FROM TTP in the peroneal tendons in the lateral foot, mild pain with resisted foot eversion. Strength 5/5 for foot eversion. Running form adequate . Heel striker.  Neurological: He is alert.  Skin: No rash noted. No erythema.  Psychiatric: He has a normal mood and affect. His behavior is normal. Thought content normal.     MSK : Swelling of peroneal tendons in the lateral foot area.     Assessment & Plan:   1. Peroneal tendonitis  meloxicam (MOBIC) 15 MG tablet   Ankle strengthening exercises Ice massage 20 min bid Mobic 15 mg  po qd x 2 weeks. Heel lift with lateral wedges to stop supination. F/U in 4 weeks

## 2012-05-12 ENCOUNTER — Other Ambulatory Visit: Payer: Self-pay | Admitting: Family Medicine

## 2012-06-10 ENCOUNTER — Other Ambulatory Visit: Payer: Self-pay | Admitting: Family Medicine

## 2012-06-15 ENCOUNTER — Ambulatory Visit (INDEPENDENT_AMBULATORY_CARE_PROVIDER_SITE_OTHER): Payer: 59 | Admitting: Family

## 2012-06-15 ENCOUNTER — Encounter: Payer: Self-pay | Admitting: Family

## 2012-06-15 ENCOUNTER — Other Ambulatory Visit: Payer: Self-pay | Admitting: *Deleted

## 2012-06-15 VITALS — BP 126/80 | Temp 97.8°F | Wt 215.0 lb

## 2012-06-15 DIAGNOSIS — M7750 Other enthesopathy of unspecified foot: Secondary | ICD-10-CM

## 2012-06-15 DIAGNOSIS — M7670 Peroneal tendinitis, unspecified leg: Secondary | ICD-10-CM

## 2012-06-15 DIAGNOSIS — J029 Acute pharyngitis, unspecified: Secondary | ICD-10-CM

## 2012-06-15 MED ORDER — MAGIC MOUTHWASH W/LIDOCAINE: 5.0000 mL | Freq: Four times a day (QID) | ORAL | Status: DC | PRN

## 2012-06-15 MED ORDER — MELOXICAM 15 MG PO TABS: 15.0000 mg | ORAL_TABLET | Freq: Every day | ORAL | Status: DC | PRN

## 2012-06-15 MED ORDER — AMOXICILLIN 500 MG PO TABS: 1000.0000 mg | ORAL_TABLET | Freq: Two times a day (BID) | ORAL | Status: AC

## 2012-06-15 MED ORDER — PANTOPRAZOLE SODIUM 40 MG PO TBEC: 40.0000 mg | DELAYED_RELEASE_TABLET | Freq: Every day | ORAL | Status: DC

## 2012-06-15 MED ORDER — MELOXICAM 15 MG PO TABS: 15.0000 mg | ORAL_TABLET | Freq: Every day | ORAL | Status: DC

## 2012-06-15 NOTE — Patient Instructions (Addendum)

## 2012-06-15 NOTE — Progress Notes (Signed)
Subjective:    Patient ID: [name] Herman, male    DOB: 1961-02-12, 52 y.o.   MRN: [id number]  HPI 52 year old white male, nonsmoker, patient of Dr. Caryl Never is in today with complaints of severe sore throat, painful swallowing and cough x3 days. Denies fever. Has no swelling to the right side. Has been taken NyQuil with no relief. Patient is a Agricultural consultant.   Review of Systems  Constitutional: Negative.  Negative for fever.  HENT: Positive for sore throat.   Respiratory: Positive for cough. Negative for shortness of breath and wheezing.   Cardiovascular: Negative.   Neurological: Negative.   Hematological: Positive for adenopathy.       Left neck  Psychiatric/Behavioral: Negative.    Past Medical History  Diagnosis Date  . Chest pain     Cardiac catheterization 07/07/10: Normal coronary arteries; EF 65%;                             GI workup inconclusive with a normal abdominal ultrasound and normal HIDA scan  . Mobitz (type) I (Wenckebach's) atrioventricular block     Documented during sleep during admission for chest pain 07/06/10 to 07/10/10  . Asymptomatic LV dysfunction     Echocardiogram 07/08/10: EF 40-45% with normal wall motion; question athlete's heart  . Myocardial infarction 07-06-2010    History   Social History  . Marital Status: Married    Spouse Name: N/A    Number of Children: N/A  . Years of Education: N/A   Occupational History  . Not on file.   Social History Main Topics  . Smoking status: Former Smoker -- 0.5 packs/day for 3 years    Types: Cigarettes    Quit date: 07/30/1980  . Smokeless tobacco: Not on file  . Alcohol Use: Not on file  . Drug Use: Not on file  . Sexually Active: Not on file   Other Topics Concern  . Not on file   Social History Narrative   The patient is married.  He is a Teacher, early years/pre.  He has a  75 year old son who just started driving and an 41 year old daughter.  He has not smoked since  high school.     Past Surgical History  Procedure Date  . None     Family History  Problem Relation Age of Onset  . Hyperlipidemia Mother   . Hyperlipidemia Father   . Heart disease Father   . Stroke Father     2010  . Hyperlipidemia Maternal Grandmother   . Hyperlipidemia Maternal Grandfather   . Diabetes Maternal Grandfather   . Hyperlipidemia Paternal Grandmother   . Cancer Paternal Grandfather     colon  . Hyperlipidemia Paternal Grandfather     No Known Allergies  Current Outpatient Prescriptions on File Prior to Visit  Medication Sig Dispense Refill  . NEXIUM 40 MG capsule TAKE 1 CAPSULE BY MOUTH ONCE DAILY  90 capsule  3  . pantoprazole (PROTONIX) 40 MG tablet Take 1 tablet (40 mg total) by mouth daily.  90 tablet  2    BP 126/80  Temp 97.8 F (36.6 C) (Oral)  Wt 215 lb (97.523 kg)chart    Objective:   Physical Exam  Constitutional: He is oriented to person, place, and time. He appears well-developed and well-nourished.  HENT:  Right Ear: External ear normal.  Left Ear: External ear normal.       Pharynx very  red, ulcerations noted bilaterally with white exudate.  Neck: Normal range of motion. Neck supple.  Cardiovascular: Normal rate, regular rhythm and normal heart sounds.   Pulmonary/Chest: Effort normal and breath sounds normal.  Lymphadenopathy:    He has cervical adenopathy.  Neurological: He is oriented to person, place, and time.  Skin: Skin is warm and dry.  Psychiatric: He has a normal mood and affect.          Assessment & Plan:  Assessment:   1. Pharyngitis-likely strep 2. GERD 3. Left Shoulder Pain  Plan: Due to the severity of his sore throat and the ulcerations and exudate, amoxicillin 500 mg 2 capsules by mouth twice a day x10 days. Magic mouthwash gargle, swish, swallow. Call the office if symptoms worsen or persist. Recheck a schedule, and as needed.

## 2012-06-17 ENCOUNTER — Encounter: Payer: Self-pay | Admitting: Family Medicine

## 2012-06-17 ENCOUNTER — Ambulatory Visit (INDEPENDENT_AMBULATORY_CARE_PROVIDER_SITE_OTHER): Payer: 59 | Admitting: Family Medicine

## 2012-06-17 ENCOUNTER — Telehealth: Payer: Self-pay | Admitting: Family

## 2012-06-17 ENCOUNTER — Telehealth: Payer: Self-pay | Admitting: Family Medicine

## 2012-06-17 VITALS — BP 104/70 | HR 112 | Temp 98.2°F | Wt 212.0 lb

## 2012-06-17 DIAGNOSIS — J029 Acute pharyngitis, unspecified: Secondary | ICD-10-CM

## 2012-06-17 DIAGNOSIS — J069 Acute upper respiratory infection, unspecified: Secondary | ICD-10-CM

## 2012-06-17 MED ORDER — VALACYCLOVIR HCL 1 G PO TABS: 2000.0000 mg | ORAL_TABLET | Freq: Two times a day (BID) | ORAL | Status: AC

## 2012-06-17 MED ORDER — MAGIC MOUTHWASH W/LIDOCAINE: 5.0000 mL | Freq: Four times a day (QID) | ORAL | Status: DC | PRN

## 2012-06-17 NOTE — Progress Notes (Signed)
Chief Complaint  Patient presents with  . Sore Throat    worsening; hard to swollow    HPI:  Acute visit for sore throat: -started: 3-5 days ago per pt  - tx with amoxicillin for pharyngitis - rapid strep neg -symptoms:nasal congestion, sore throat, cough -denies:fever, SOB, NVD, tooth pain, strep or mono exposure -has tried: taking amoxicillin, magic mouthwash -sick contacts: folks at work -Hx of: immuosupprssion  ROS: See pertinent positives and negatives per HPI.  Past Medical History  Diagnosis Date  . Chest pain     Cardiac catheterization 07/07/10: Normal coronary arteries; EF 65%;                             GI workup inconclusive with a normal abdominal ultrasound and normal HIDA scan  . Mobitz (type) I (Wenckebach's) atrioventricular block     Documented during sleep during admission for chest pain 07/06/10 to 07/10/10  . Asymptomatic LV dysfunction     Echocardiogram 07/08/10: EF 40-45% with normal wall motion; question athlete's heart  . Myocardial infarction 07-06-2010    Family History  Problem Relation Age of Onset  . Hyperlipidemia Mother   . Hyperlipidemia Father   . Heart disease Father   . Stroke Father     2010  . Hyperlipidemia Maternal Grandmother   . Hyperlipidemia Maternal Grandfather   . Diabetes Maternal Grandfather   . Hyperlipidemia Paternal Grandmother   . Cancer Paternal Grandfather     colon  . Hyperlipidemia Paternal Grandfather     History   Social History  . Marital Status: Married    Spouse Name: N/A    Number of Children: N/A  . Years of Education: N/A   Social History Main Topics  . Smoking status: Former Smoker -- 0.5 packs/day for 3 years    Types: Cigarettes    Quit date: 07/30/1980  . Smokeless tobacco: None  . Alcohol Use: None  . Drug Use: None  . Sexually Active: None   Other Topics Concern  . None   Social History Narrative   The patient is married.  He is a Teacher, early years/pre.  He has a  25 year old son who just  started driving and an 5 year old daughter.  He has not smoked since high school.     Current outpatient prescriptions:Alum & Mag Hydroxide-Simeth (MAGIC MOUTHWASH W/LIDOCAINE) SOLN, Take 5 mLs by mouth 4 (four) times daily as needed., Disp: 140 mL, Rfl: 0;  amoxicillin (AMOXIL) 500 MG tablet, Take 2 tablets (1,000 mg total) by mouth 2 (two) times daily., Disp: 40 tablet, Rfl: 0;  meloxicam (MOBIC) 15 MG tablet, Take 1 tablet (15 mg total) by mouth daily as needed for pain., Disp: 30 tablet, Rfl: 1 NEXIUM 40 MG capsule, TAKE 1 CAPSULE BY MOUTH ONCE DAILY, Disp: 90 capsule, Rfl: 3;  pantoprazole (PROTONIX) 40 MG tablet, Take 1 tablet (40 mg total) by mouth daily., Disp: 90 tablet, Rfl: 2  EXAM:  Filed Vitals:   06/17/12 1105  BP: 104/70  Pulse: 112  Temp: 98.2 F (36.8 C)    There is no height on file to calculate BMI.  GENERAL: vitals reviewed and listed above, alert, oriented, appears well hydrated and in no acute distress  HEENT: atraumatic, conjunttiva clear, no obvious abnormalities on inspection of external nose and ears, normal appearance of ear canals and TMs, clear nasal congestion, mild post oropharyngeal erythema with PND and soft palate ulcers, no tonsillar edema or exudate,  no sinus TTP  NECK: no obvious masses on inspection  MS: moves all extremities without noticeable abnormality  PSYCH: pleasant and cooperative, no obvious depression or anxiety  ASSESSMENT AND PLAN:  Discussed the following assessment and plan:  1. URI (upper respiratory infection)  Alum & Mag Hydroxide-Simeth (MAGIC MOUTHWASH W/LIDOCAINE) SOLN  2. Pharyngitis  Alum & Mag Hydroxide-Simeth (MAGIC MOUTHWASH W/LIDOCAINE) SOLN   -? Viral pharyngitis, discussed possibilities including but not limited to HIV, HSV, CMV and other common URIs. Usual care is supportive for 5-7 days if strep neg (strep test not done and already on abx.)  -more likely common URI - pt would like to hold off on labs, refilled  magic mouthwash, hydrogen peroxide rinse - Follow up if not improving by next week. -Patient advised to return or notify a doctor immediately if symptoms worsen or persist or new concerns arise.  Patient Instructions  -hydrogen peroxide gargle twice daily  -tylenol and magic mouthwash as needed per instructions  -finish amoxicillin  -follow up if not better in 1 week or worsening syptoms     Ashleigh Luckow R.

## 2012-06-17 NOTE — Telephone Encounter (Signed)
noted 

## 2012-06-17 NOTE — Patient Instructions (Addendum)
-  hydrogen peroxide gargle twice daily  -tylenol and magic mouthwash as needed per instructions  -finish amoxicillin  -follow up if not better in 1 week or worsening syptoms

## 2012-06-17 NOTE — Telephone Encounter (Signed)
Patient Information:  Caller Name: Reynold  Phone: [phone]  Patient: [name] Herman, [name] Herman  Gender: Male  DOB: April 20, 1961  Age: 52 Years  PCP: Evelena Peat [facility]  Office Follow Up:  Does the office need to follow up with this patient?: No  Instructions For The Office: N/A   Symptoms  Reason For Call & Symptoms: Severe throat pain and not improving with Amoxicillin. Pt can see more ulcerations on back of throat since OV  Reviewed Health History In EMR: Yes  Reviewed Medications In EMR: Yes  Reviewed Allergies In EMR: Yes  Reviewed Surgeries / Procedures: Yes  Date of Onset of Symptoms: 06/13/2012  Treatments Tried: Taking Ibuprofen 800mg  TID , but doesn't help  Treatments Tried Worked: No  Guideline(s) Used:  Sore Throat  Disposition Per Guideline:   See Today in Office  Reason For Disposition Reached:   Severe sore throat pain  Advice Given:  For Relief of Sore Throat Pain:  Suck on hard candy or a throat lozenge (over-the-counter).  Pain Medicines:  For pain relief, you can take either acetaminophen, ibuprofen, or naproxen.  Call Back If:  You become worse.  Appointment Scheduled:  06/17/2012 11:00:00 Appointment Scheduled Provider:  Kriste Basque [facility])

## 2012-06-17 NOTE — Telephone Encounter (Signed)
Patient called with persistent sore throat. I saw patient on Monday, treated with Amoxil and Magic Mouthwash. Sx no better. Has a history of HSV1.Increased stress at work. Symptoms more likely to be an outbreak of HSV than Strep but we will continue current treatment plus Valtrex.

## 2012-10-04 ENCOUNTER — Ambulatory Visit (INDEPENDENT_AMBULATORY_CARE_PROVIDER_SITE_OTHER): Payer: 59 | Admitting: Sports Medicine

## 2012-10-04 VITALS — BP 125/79 | Ht 73.0 in | Wt 205.0 lb

## 2012-10-04 DIAGNOSIS — M25519 Pain in unspecified shoulder: Secondary | ICD-10-CM

## 2012-10-04 DIAGNOSIS — M25511 Pain in right shoulder: Secondary | ICD-10-CM

## 2012-10-04 MED ORDER — MELOXICAM 15 MG PO TABS: 15.0000 mg | ORAL_TABLET | Freq: Every day | ORAL | Status: DC

## 2012-10-04 NOTE — Progress Notes (Signed)
  Subjective:    Patient ID: [name] Herman, male    DOB: 01-01-1961, 52 y.o.   MRN: [id number]  HPI chief complaint: Right shoulder pain  Very pleasant 52 year old right-hand-dominant male comes in today complaining of 3-5 days of right shoulder pain. No injury that he can recall. He enjoys lifting weights and has recently added in a couple of new exercises. He also unloaded 16 yards of mulch over the weekend. He is complaining of a dull discomfort along the superior shoulder. He denies problems with this shoulder in the past but he had rotator cuff issues in the left shoulder previously which responded to a cortisone injection. Earlier today he noticed some discomfort in the antecubital fossa as well as in the right thumb and is a little concerned that he may be experiencing some radicular symptoms. He's not had any significant radiculopathy in the past. He's taken some over-the-counter ibuprofen. He is also been using some capsaicin combined with topical Voltaren.  Medical history is reviewed as are his current medications \ No known drug allergies    Review of Systems     Objective:   Physical Exam Well-developed, fit appearing 52 year old male in no acute distress  Cervical spine: Full painless cervical range of motion with a negative Spurlings Right shoulder: Full range of motion. Slight tenderness to palpation over the a.c. joint with a slightly positive cross arm abduction test. Rotator cuff strength is 5/5 bilaterally. Negative empty can, negative Hawkins. No tenderness over the bicipital groove. Negative O'Briens. Right elbow: No tenderness to palpation over the lateral elbow or over the distal biceps tendon Neurological exam: Strength is 5/5 both upper extremities. Reflexes are 1/4 and equal at the biceps, triceps, and brachial radialis tendons. No atrophy. Good radial and ulnar pulses.       Assessment & Plan:  1. Right shoulder pain likely secondary to a.c. joint synovitis  versus possible a.c. joint DJD  I think the patient should refrain from upper body resistance training for the next week. Mobic 15 mg daily for the next 3-5 days then when necessary. He can continue with his topical Voltaren/capsaicin cream as well. I reassured him that I don't think his symptoms are radicular in nature. If symptoms persist we could consider merits of a diagnostic/therapeutic ultrasound guided a.c. joint injection. Patient will followup for ongoing or recalcitrant issues.

## 2012-11-11 ENCOUNTER — Telehealth: Payer: Self-pay

## 2012-11-11 MED ORDER — PIMECROLIMUS 1 % EX CREA: TOPICAL_CREAM | Freq: Two times a day (BID) | CUTANEOUS | Status: DC

## 2012-11-11 NOTE — Telephone Encounter (Signed)
Refill of Elidel cream

## 2012-11-16 IMAGING — CR DG CHEST 2V
2 series · 2 of 2 positions shown · non-contrast
Comparison: Report of prior exam 05/24/2002, not digitized

CLINICAL DATA: Chest pain

CHEST - 2 VIEW

[w chest pa]
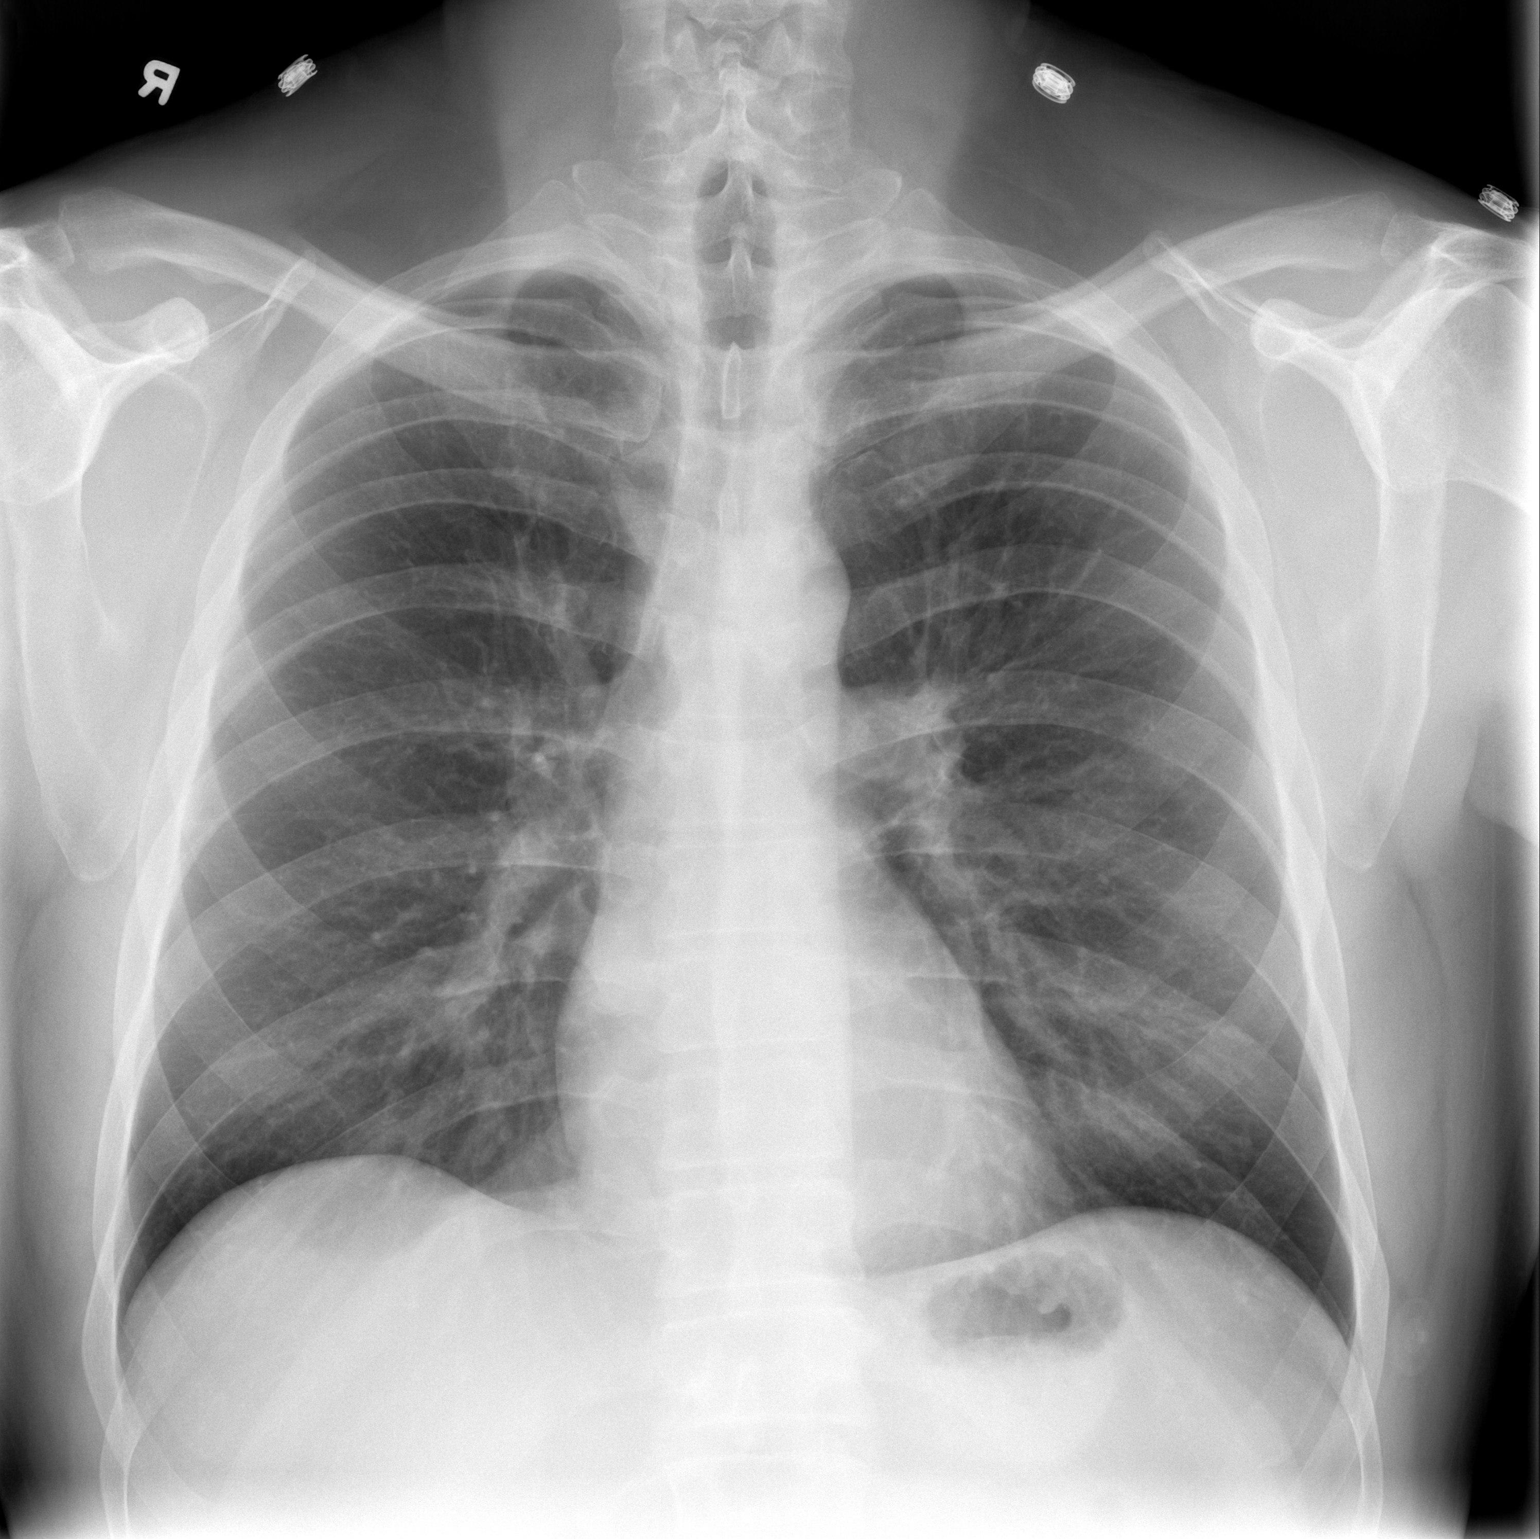

[w chest lat]
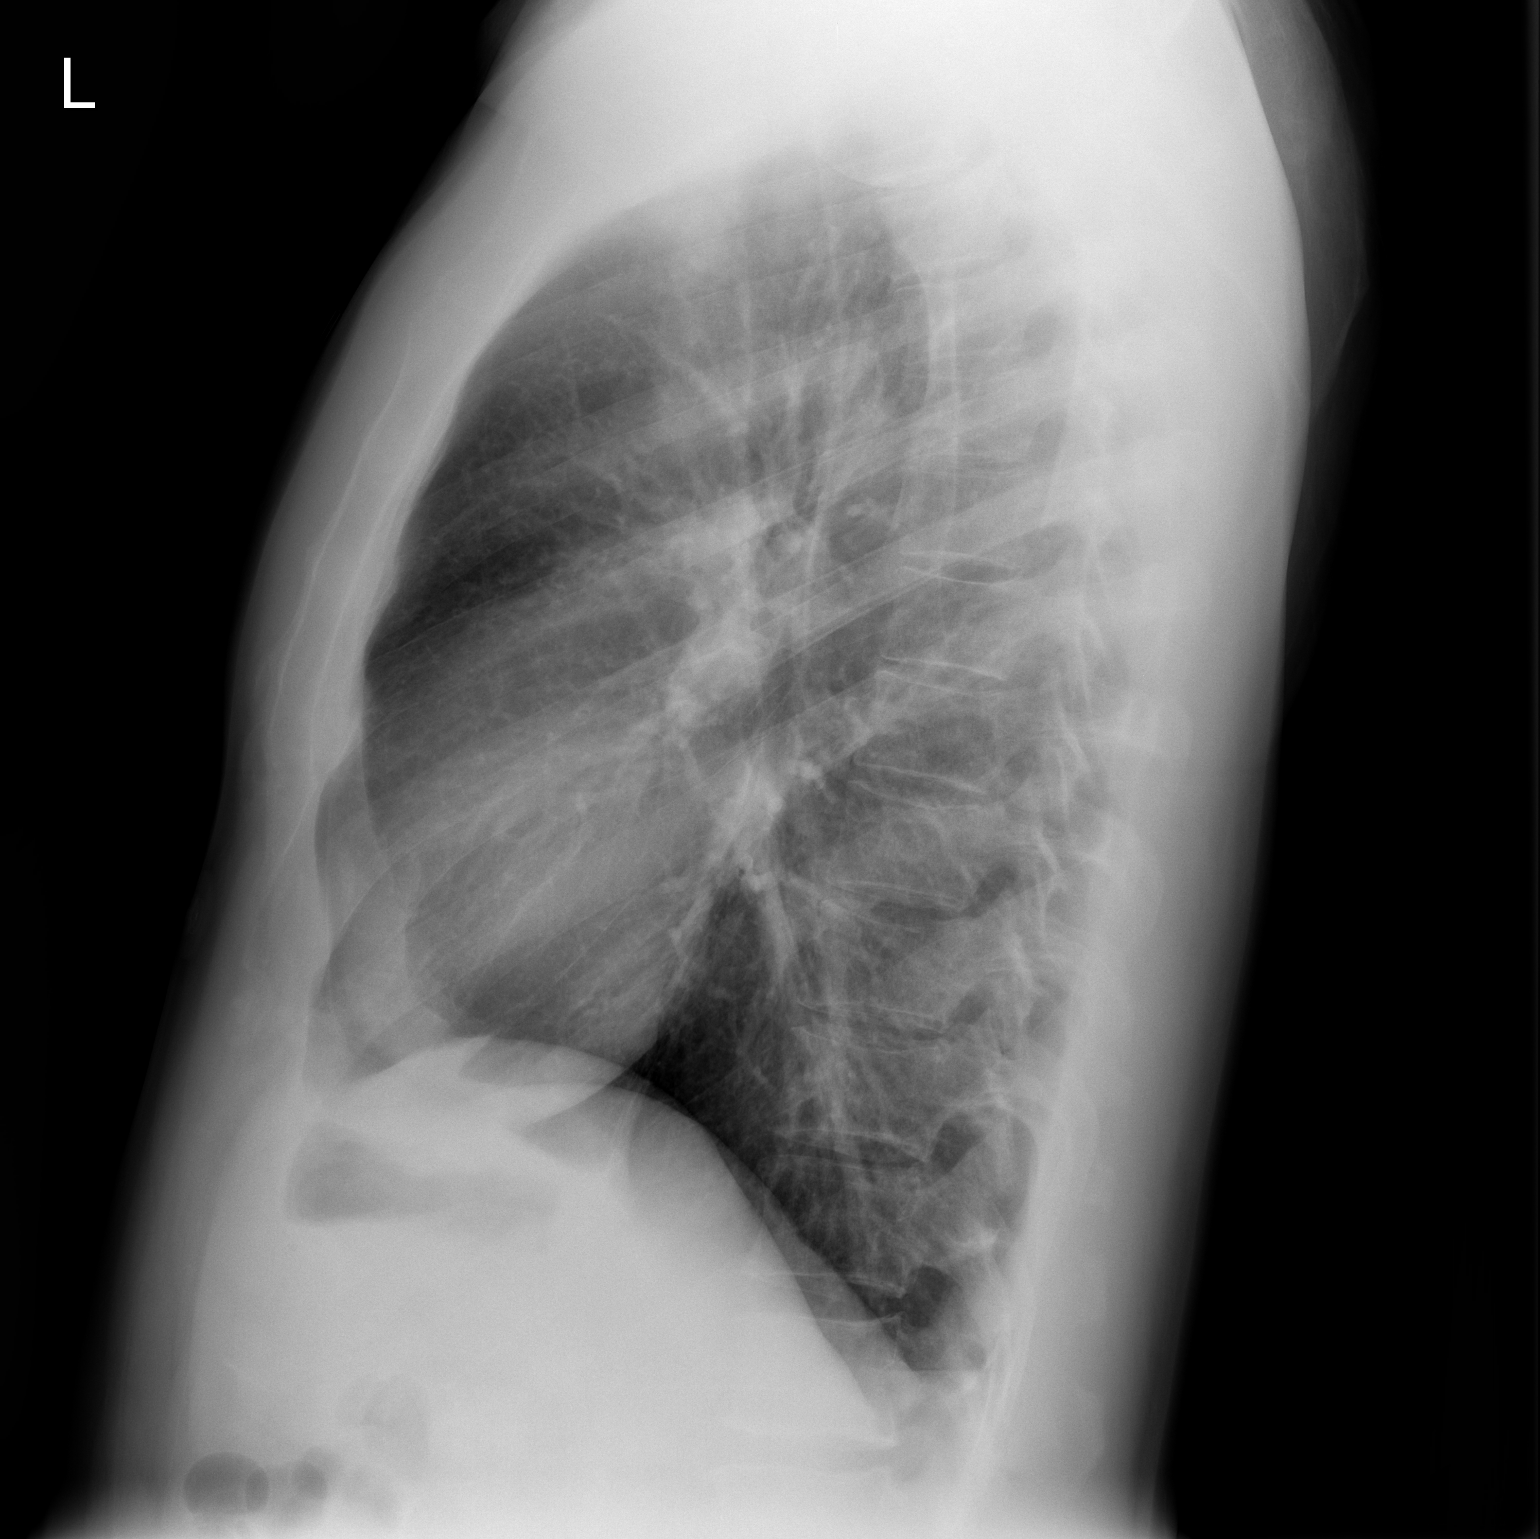

[2 of 2 positions shown; findings below may reference images not displayed]

FINDINGS: Cardiomediastinal silhouette is within normal limits. The
lungs are clear. No pleural effusion.  No pneumothorax.  No acute
osseous abnormality.
IMPRESSION: Normal exam.

## 2012-11-18 IMAGING — US US ABDOMEN COMPLETE
1 series · 14 of 25 positions shown · non-contrast
Comparison: None.

CLINICAL DATA: Epigastric pain

ABDOMINAL ULTRASOUND COMPLETE

[Series 1: us abdomen complete · 0.25mm/px · 14 of 68 slices shown]
[im 1/68]
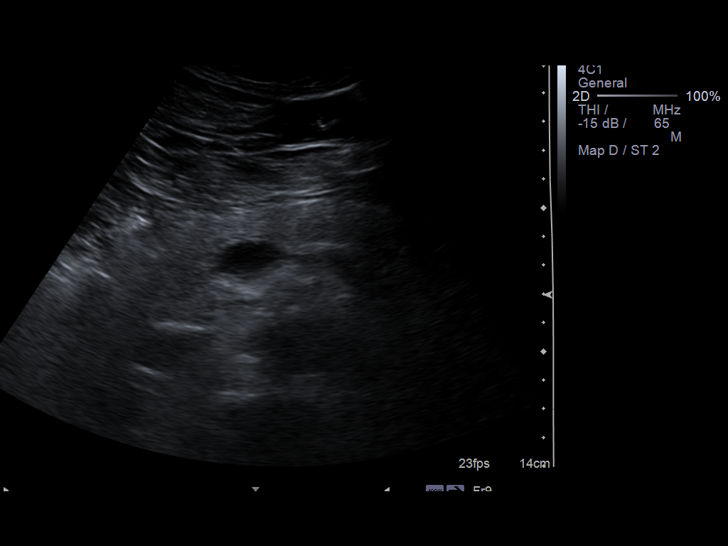
[im 6/68]
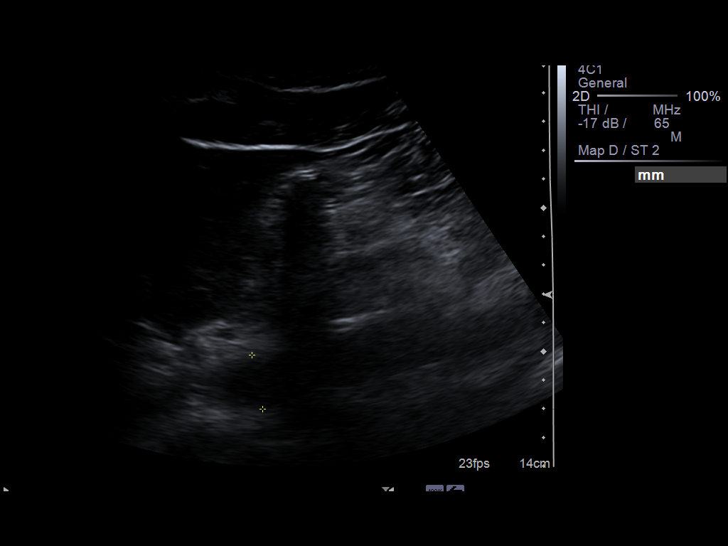
[im 12/68]
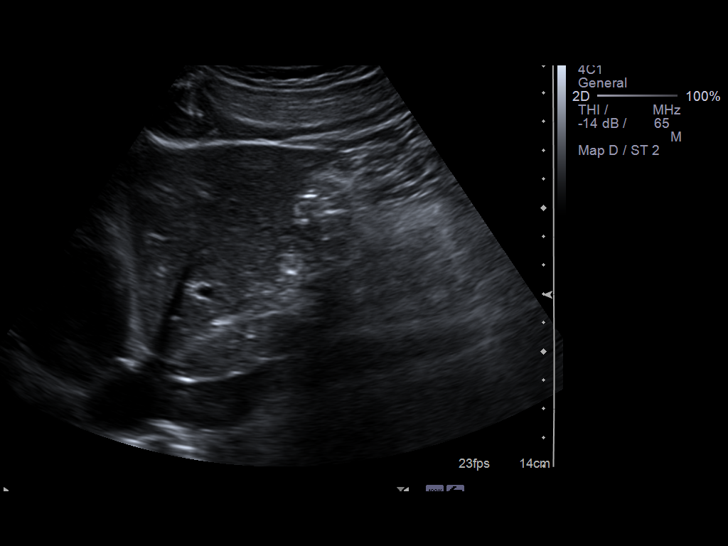
[im 17/68]
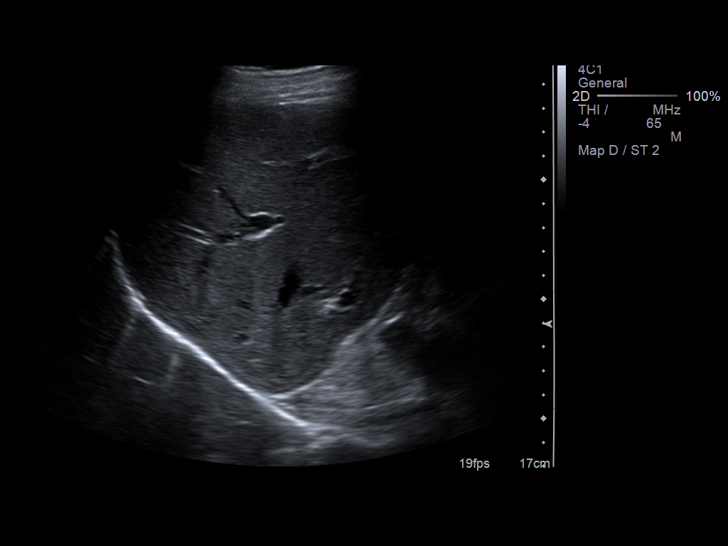
[im 23/68]
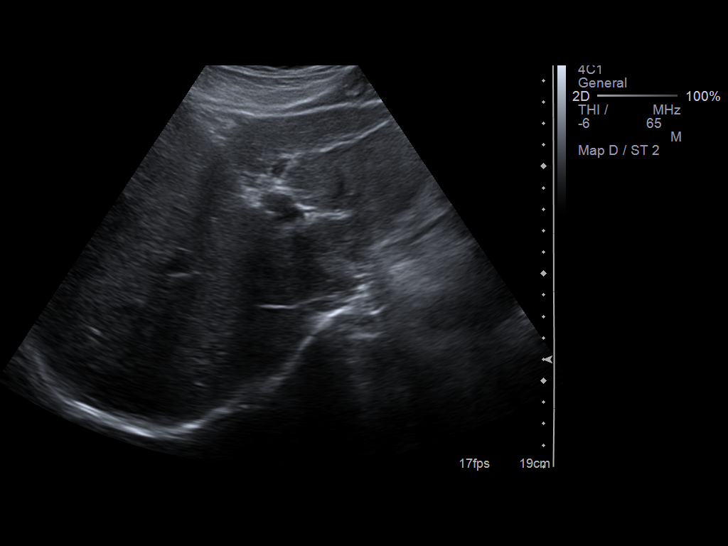
[im 26/68]
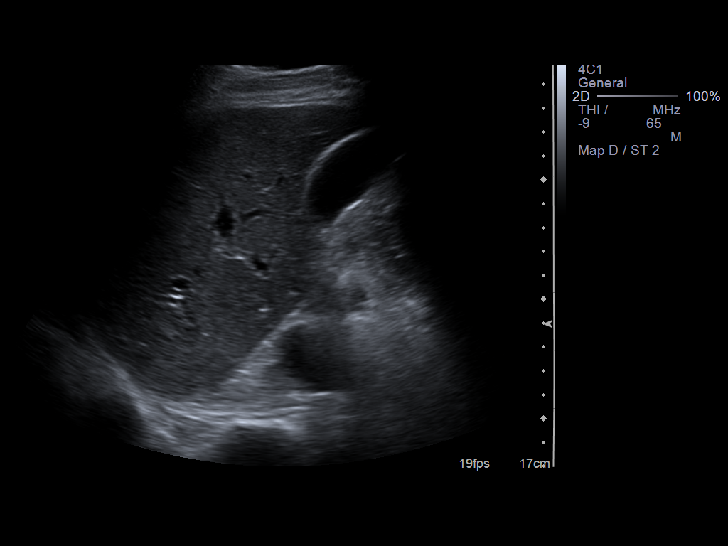
[im 31/68]
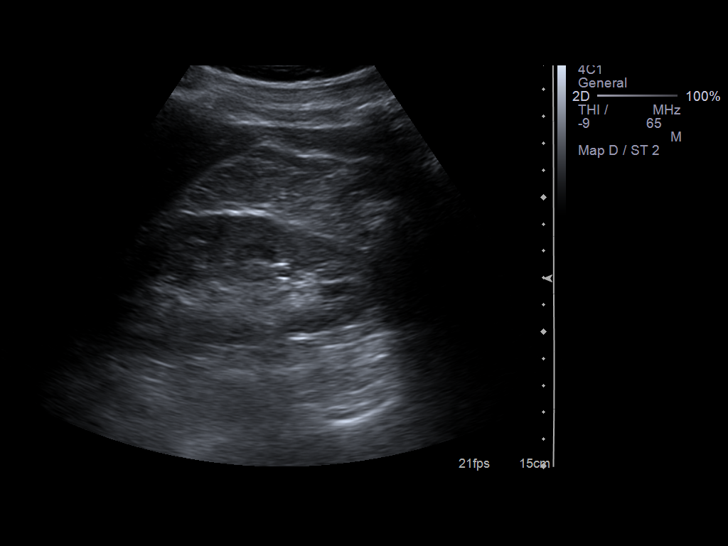
[im 37/68]
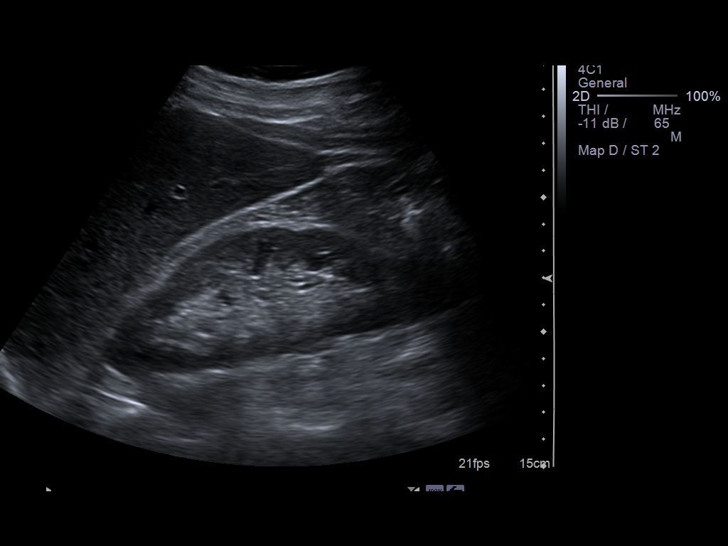
[im 42/68]
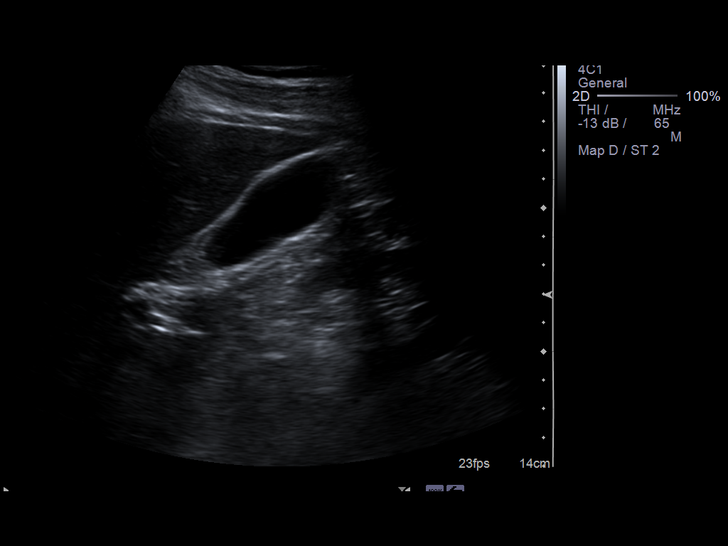
[im 45/68]
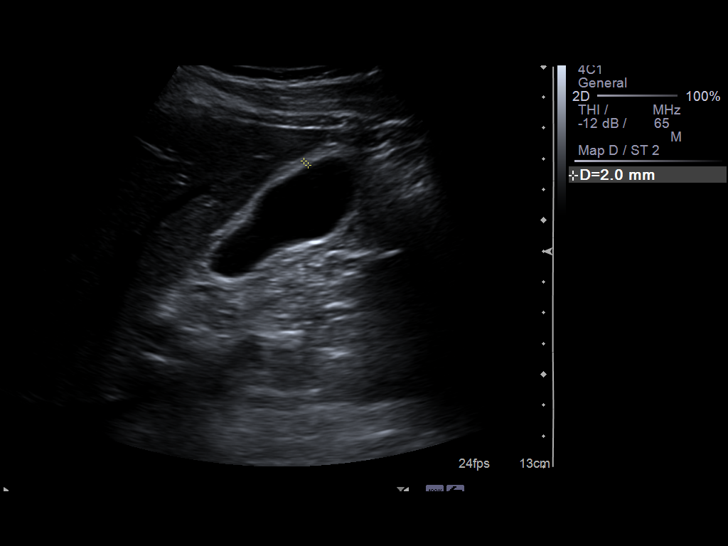
[im 51/68]
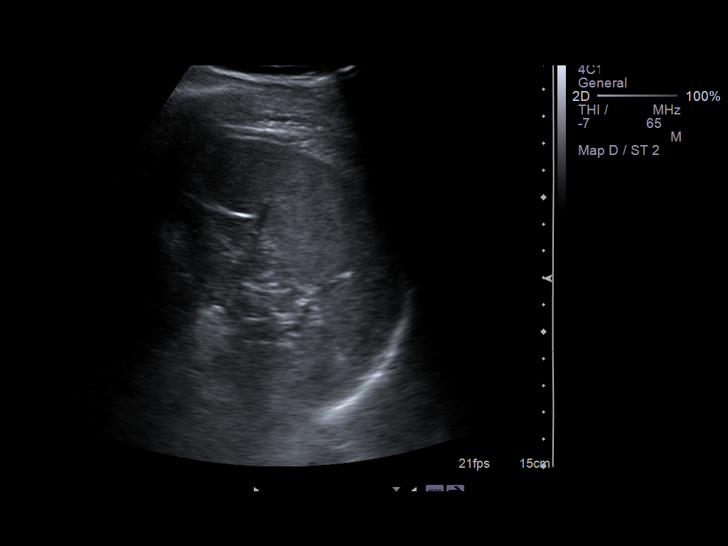
[im 56/68]
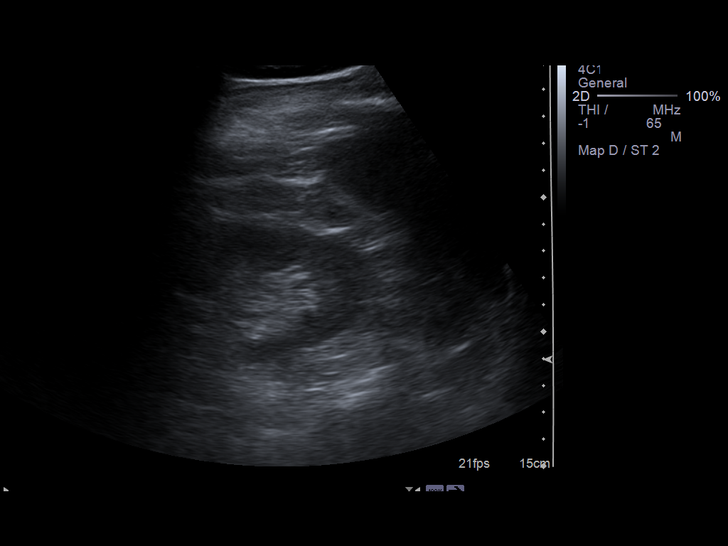
[im 62/68]
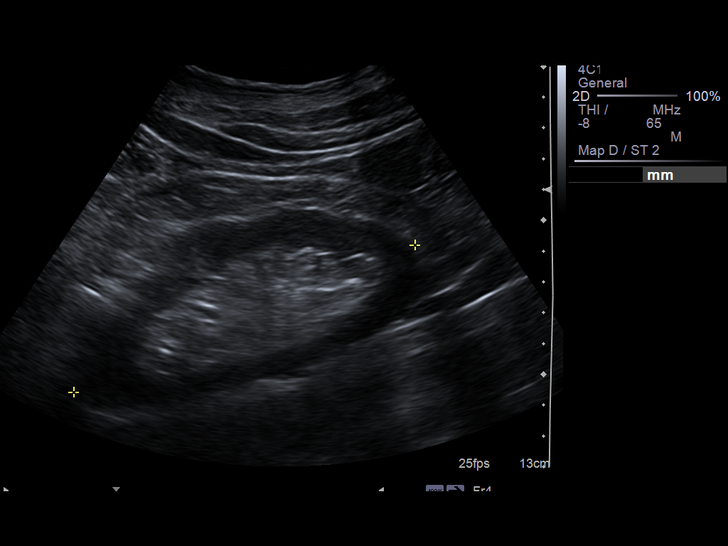
[im 68/68]
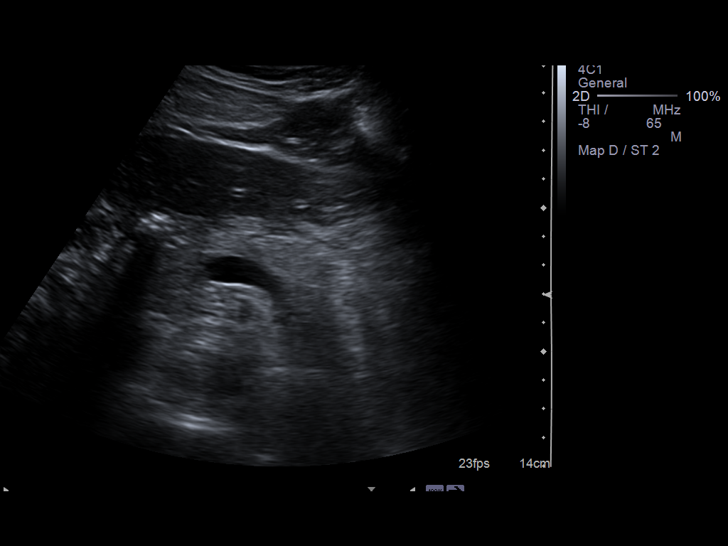

[14 of 25 positions shown; findings below may reference images not displayed]

FINDINGS: Gallbladder:  No gallstones, gallbladder wall thickening, or
pericholecystic fluid.  Negative sonographic Murphy's sign.

Common Bile Duct:  Within normal limits in caliber.  Measures 2 mm.

Liver: No focal mass lesion identified.  Within normal limits in
parenchymal echogenicity.

IVC:  Appears normal.

Pancreas:  No abnormality identified.

Spleen:  Within normal limits in size and echotexture.

Right kidney:  Normal in size and parenchymal echogenicity.  No
evidence of mass or hydronephrosis.

Left kidney:  Normal in size and parenchymal echogenicity.  No
evidence of mass or hydronephrosis.

Abdominal Aorta:  No aneurysm identified.
IMPRESSION: Negative abdominal ultrasound.

## 2013-02-13 ENCOUNTER — Encounter: Payer: Self-pay | Admitting: Sports Medicine

## 2013-02-13 ENCOUNTER — Ambulatory Visit (INDEPENDENT_AMBULATORY_CARE_PROVIDER_SITE_OTHER): Payer: 59 | Admitting: Sports Medicine

## 2013-02-13 VITALS — BP 130/80 | HR 93 | Ht 73.0 in | Wt 201.0 lb

## 2013-02-13 DIAGNOSIS — M25519 Pain in unspecified shoulder: Secondary | ICD-10-CM

## 2013-02-13 DIAGNOSIS — M25511 Pain in right shoulder: Secondary | ICD-10-CM

## 2013-02-13 MED ORDER — METHYLPREDNISOLONE ACETATE 40 MG/ML IJ SUSP
40.0000 mg | Freq: Once | INTRAMUSCULAR | Status: AC
  Administered 2013-02-13: 40 mg via INTRA_ARTICULAR

## 2013-02-13 NOTE — Progress Notes (Signed)
  Subjective:    Patient ID: [name] Herman, male    DOB: 04-07-1961, 52 y.o.   MRN: [id number]  HPI Patient comes in today with persistent right shoulder pain. He continues to localize his pain to the right a.c. joint. It is beginning to interfere with his activities of daily living. He continues to lift weights. No radiating pain. Pain will occasionally awaken him at night. No recent trauma.    Review of Systems     Objective:   Physical Exam Well-developed, well-nourished. No acute distress.  Right shoulder: Full range of motion. There is tenderness to palpation directly over the a.c. Joint. Positive cross arm abduction test. Rotator cuff strength is 5/5 and non-reproducible pain. Negative he can, negative Hawkins. Neurovascularly intact distally.       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 joint under ultrasound today. Post procedure the patient suffered a vasovagal syncopal episode. Patient was positioned in a supine position with feet elevated and after about 5 minutes was fully recovered. I've reassured him that he can continue with activity as tolerated but that heavy lifting may continue to aggravate his condition. If pain persists or worsens despite today's injection I would start with a plain x-ray of his shoulder to evaluate the degree of A.C. DJD. I've also given him some basic rotator cuff strengthening exercises to begin doing. Followup when necessary.  Consent obtained and verified. Time-out conducted. Noted no overlying erythema, induration, or other signs of local infection. Skin prepped in a sterile fashion. Topical analgesic spray: Ethyl chloride. Joint: right AC joint Needle: 25g 5/8 inch Completed without difficulty. Meds: 1cc (40mg ) depomedrol, 1cc 1% xylocaine  Advised to call if fevers/chills, erythema,  induration, drainage, or persistent bleeding.

## 2013-02-16 ENCOUNTER — Ambulatory Visit: Payer: 59 | Admitting: Sports Medicine

## 2013-03-23 ENCOUNTER — Other Ambulatory Visit: Payer: Self-pay

## 2013-04-14 ENCOUNTER — Other Ambulatory Visit: Payer: Self-pay | Admitting: Family

## 2013-07-06 ENCOUNTER — Other Ambulatory Visit: Payer: Self-pay | Admitting: Family Medicine

## 2013-07-11 ENCOUNTER — Encounter (HOSPITAL_COMMUNITY): Payer: Self-pay | Admitting: Emergency Medicine

## 2013-07-11 ENCOUNTER — Emergency Department (HOSPITAL_COMMUNITY): Payer: 59

## 2013-07-11 ENCOUNTER — Emergency Department (HOSPITAL_COMMUNITY)
Admission: EM | Admit: 2013-07-11 | Discharge: 2013-07-12 | Disposition: A | Discharge status: Home / Self Care | Payer: 59 | Attending: Emergency Medicine | Admitting: Emergency Medicine

## 2013-07-11 DIAGNOSIS — R079 Chest pain, unspecified: Secondary | ICD-10-CM | POA: Insufficient documentation

## 2013-07-11 DIAGNOSIS — Z87891 Personal history of nicotine dependence: Secondary | ICD-10-CM | POA: Insufficient documentation

## 2013-07-11 DIAGNOSIS — I441 Atrioventricular block, second degree: Secondary | ICD-10-CM | POA: Insufficient documentation

## 2013-07-11 DIAGNOSIS — I519 Heart disease, unspecified: Secondary | ICD-10-CM | POA: Insufficient documentation

## 2013-07-11 DIAGNOSIS — I252 Old myocardial infarction: Secondary | ICD-10-CM | POA: Insufficient documentation

## 2013-07-11 LAB — CBC WITH DIFFERENTIAL/PLATELET
BASOS ABS: 0 10*3/uL (ref 0.0–0.1)
Basophils Relative: 0 % (ref 0–1)
Eosinophils Absolute: 0.1 10*3/uL (ref 0.0–0.7)
Eosinophils Relative: 1 % (ref 0–5)
HCT: 43.2 % (ref 39.0–52.0)
Hemoglobin: 15.1 g/dL (ref 13.0–17.0)
LYMPHS ABS: 1.8 10*3/uL (ref 0.7–4.0)
LYMPHS PCT: 24 % (ref 12–46)
MCH: 33.3 pg (ref 26.0–34.0)
MCHC: 35 g/dL (ref 30.0–36.0)
MCV: 95.2 fL (ref 78.0–100.0)
MONO ABS: 0.9 10*3/uL (ref 0.1–1.0)
MONOS PCT: 12 % (ref 3–12)
NEUTROS ABS: 4.6 10*3/uL (ref 1.7–7.7)
Neutrophils Relative %: 62 % (ref 43–77)
Platelets: 299 10*3/uL (ref 150–400)
RBC: 4.54 MIL/uL (ref 4.22–5.81)
RDW: 12.4 % (ref 11.5–15.5)
WBC: 7.4 10*3/uL (ref 4.0–10.5)

## 2013-07-11 LAB — I-STAT CHEM 8, ED
BUN: 21 mg/dL (ref 6–23)
CALCIUM ION: 1.2 mmol/L (ref 1.12–1.23)
CREATININE: 1.2 mg/dL (ref 0.50–1.35)
Chloride: 103 mEq/L (ref 96–112)
GLUCOSE: 105 mg/dL — AB (ref 70–99)
HCT: 47 % (ref 39.0–52.0)
HEMOGLOBIN: 16 g/dL (ref 13.0–17.0)
Potassium: 3.6 mEq/L — ABNORMAL LOW (ref 3.7–5.3)
SODIUM: 141 meq/L (ref 137–147)
TCO2: 26 mmol/L (ref 0–100)

## 2013-07-11 LAB — I-STAT TROPONIN, ED: Troponin i, poc: 0 ng/mL (ref 0.00–0.08)

## 2013-07-11 MED ORDER — ASPIRIN 81 MG PO CHEW
324.0000 mg | CHEWABLE_TABLET | Freq: Once | ORAL | Status: AC
  Administered 2013-07-11: 324 mg via ORAL
  Filled 2013-07-11: qty 4

## 2013-07-11 NOTE — ED Provider Notes (Signed)
CSN: [id number]     Arrival date & time 07/11/13  2140 History   First MD Initiated Contact with Patient 07/11/13 2322     Chief Complaint  Patient presents with  . Chest Pain     (Consider location/radiation/quality/duration/timing/severity/associated sxs/prior Treatment) HPI Comments: 53 year old male, history of chest pain , was admitted to the hospital 3 years ago and at that time had a low positive troponin, had a hospitalization that included a cardiac catheterization and an echocardiogram that showed nonobstructive coronary disease. He did not followup as an outpatient, he has had a relatively benign 3 years and exercises regularly 4 times a week during cardiac activities without any difficulty with dyspnea or chest pain on exertion.  He describes an anxiety provoking episode that occurred last night that prompted mild palpitations, slight chest pain on the left side of his chest which has been somewhat intermittent but not brought on by exertion or position. He denies fevers chills coughing shortness of breath nausea vomiting or swelling of the lower extremities. He was on a recent trip at less than 4 hours on an airplane and has no history of pulmonary embolism or venous thromboembolism. He takes ibuprofen and a proton pump inhibitor, he does not take any other cardiac medications, is a former smoker and drinks occasional alcohol. Currently he has a mild chest pain under his left pectoralis, he reports a episode of hypertension throughout the day up to 854 systolic. He is unsure of his thyroid function but has had no unexpected weight loss or other thyrotoxic symptoms  Patient is a 53 y.o. male presenting with chest pain. The history is provided by the patient.  Chest Pain   Past Medical History  Diagnosis Date  . Chest pain     Cardiac catheterization 07/07/10: Normal coronary arteries; EF 65%;                             GI workup inconclusive with a normal abdominal ultrasound and  normal HIDA scan  . Mobitz (type) I (Wenckebach's) atrioventricular block     Documented during sleep during admission for chest pain 07/06/10 to 07/10/10  . Asymptomatic LV dysfunction     Echocardiogram 07/08/10: EF 40-45% with normal wall motion; question athlete's heart  . Myocardial infarction 07-06-2010   Past Surgical History  Procedure Laterality Date  . None     Family History  Problem Relation Age of Onset  . Hyperlipidemia Mother   . Hyperlipidemia Father   . Heart disease Father   . Stroke Father     2010  . Hyperlipidemia Maternal Grandmother   . Hyperlipidemia Maternal Grandfather   . Diabetes Maternal Grandfather   . Hyperlipidemia Paternal Grandmother   . Cancer Paternal Grandfather     colon  . Hyperlipidemia Paternal Grandfather    History  Substance Use Topics  . Smoking status: Former Smoker -- 0.50 packs/day for 3 years    Types: Cigarettes    Quit date: 07/30/1980  . Smokeless tobacco: Not on file  . Alcohol Use: Not on file    Review of Systems  Cardiovascular: Positive for chest pain.  All other systems reviewed and are negative.      Allergies  Review of patient's allergies indicates no known allergies.  Home Medications   Current Outpatient Rx  Name  Route  Sig  Dispense  Refill  . ibuprofen (ADVIL,MOTRIN) 200 MG tablet   Oral   Take  800 mg by mouth daily as needed (pain).         . pantoprazole (PROTONIX) 40 MG tablet   Oral   Take 40 mg by mouth daily.          BP 113/81  Pulse 64  Temp(Src) 97.9 F (36.6 C) (Oral)  Resp 13  SpO2 94% Physical Exam  Nursing note and vitals reviewed. Constitutional: He appears well-developed and well-nourished. No distress.  HENT:  Head: Normocephalic and atraumatic.  Mouth/Throat: Oropharynx is clear and moist. No oropharyngeal exudate.  Eyes: Conjunctivae and EOM are normal. Pupils are equal, round, and reactive to light. Right eye exhibits no discharge. Left eye exhibits no  discharge. No scleral icterus.  Neck: Normal range of motion. Neck supple. No JVD present. No thyromegaly present.  Cardiovascular: Normal rate, regular rhythm, normal heart sounds and intact distal pulses.  Exam reveals no gallop and no friction rub.   No murmur heard. Pulmonary/Chest: Effort normal and breath sounds normal. No respiratory distress. He has no wheezes. He has no rales.  Abdominal: Soft. Bowel sounds are normal. He exhibits no distension and no mass. There is no tenderness.  Musculoskeletal: Normal range of motion. He exhibits no edema and no tenderness.  Lymphadenopathy:    He has no cervical adenopathy.  Neurological: He is alert. Coordination normal.  Skin: Skin is warm and dry. No rash noted. No erythema.  Psychiatric: He has a normal mood and affect. His behavior is normal.    ED Course  Procedures (including critical care time) Labs Review Labs Reviewed  I-STAT CHEM 8, ED - Abnormal; Notable for the following:    Potassium 3.6 (*)    Glucose, Bld 105 (*)    All other components within normal limits  CBC WITH DIFFERENTIAL  TROPONIN I  TSH  I-STAT TROPOININ, ED   Imaging Review Dg Chest 2 View  07/11/2013   CLINICAL DATA:  Chest pain and shortness of breath.  Hypertension.  EXAM: CHEST  2 VIEW  COMPARISON:  Chest radiograph performed 07/06/2010  FINDINGS: The lungs are well-aerated and clear. There is no evidence of focal opacification, pleural effusion or pneumothorax.  The heart is normal in size; the mediastinal contour is within normal limits. No acute osseous abnormalities are seen.  IMPRESSION: No acute cardiopulmonary process seen.   Electronically Signed   By: Garald Balding M.D.   On: 07/11/2013 22:44    EKG Interpretation    Date/Time:  Tuesday July 11 2013 21:46:53 EST Ventricular Rate:  81 PR Interval:  164 QRS Duration: 84 QT Interval:  352 QTC Calculation: 408 R Axis:   78 Text Interpretation:  Sinus rhythm with marked sinus arrhythmia  Otherwise normal ECG since last tracing no significant change Confirmed by Novella Abraha  MD, [place] ([id number]) on 07/11/2013 11:23:20 PM            MDM   Final diagnoses:  Chest pain    The patient is relatively benign exam, his EKG shows slight sinus arrhythmia but no signs of ST elevation or atrioventricular block. It was described during his hospitalization he had a brief episode of Mobitz 1 block when he was sleeping that resolved. At this time he is well-appearing, we'll obtain a second troponin as he has had an elevated troponin the past though there was no definite etiology at that time. The patient is in agreement with the plan  Repeat troponin is normal, the patient has been stable and appears stable for discharge  Doubt  acute coronary syndrome or pulmonary embolus, patient informed of results and requested to followup with cardiology, he is in agreement with the plan.  Johnna Acosta, MD 07/12/13 [phone]

## 2013-07-11 NOTE — ED Notes (Addendum)
C/o Chest pressure, also mentions belching and indigestion, onset last night with diaphoresis and elevated HR, resolved on its own. Returned again [employer].  "H/o NSTEMI and code prior to clean cath", (denies: sob, cough, congestion, cold sx, fever), no aggravating or aleviating factors. No relief with extra protonix PTA. Has seen Dr. Percival Spanish in the past. Wife mentions LV mentioned at [facility]. Gall bladder acts up every once in awhile, but pinpoints pain to L axillary area and area of PMI. "BP and HR were elevated PTA".

## 2013-07-11 NOTE — ED Notes (Signed)
PT states he thinks this is gallbladder related. Points to left side of chest states pressure and pain, hurts upon palpation. Denies N/V/dizzyness

## 2013-07-11 NOTE — ED Notes (Signed)
Pt to xray via w/c, wife at side, remains alert, NAD, calm, interactive.

## 2013-07-11 NOTE — ED Notes (Signed)
Returned from [employer], wife at [facility], no changes, alert, NAD, calm, returned to monitor.

## 2013-07-12 LAB — TROPONIN I: Troponin I: <0.3 ng/mL (ref <0.30)

## 2013-07-12 LAB — TSH: TSH: 2.201 u[IU]/mL (ref 0.350–4.500)

## 2013-07-12 NOTE — Discharge Instructions (Signed)
Start taking a daily baby aspirin  Please call your doctor for a followup appointment within 24-48 hours. When you talk to your doctor please let them know that you were seen in the emergency department and have them acquire all of your records so that they can discuss the findings with you and formulate a treatment plan to fully care for your new and ongoing problems.

## 2013-07-14 ENCOUNTER — Encounter: Payer: Self-pay | Admitting: Family Medicine

## 2013-07-14 ENCOUNTER — Ambulatory Visit (INDEPENDENT_AMBULATORY_CARE_PROVIDER_SITE_OTHER): Payer: 59 | Admitting: Family Medicine

## 2013-07-14 VITALS — BP 130/70 | HR 77 | Temp 98.0°F | Wt 210.0 lb

## 2013-07-14 DIAGNOSIS — R079 Chest pain, unspecified: Secondary | ICD-10-CM

## 2013-07-14 MED ORDER — PANTOPRAZOLE SODIUM 40 MG PO TBEC: 40.0000 mg | DELAYED_RELEASE_TABLET | Freq: Every day | ORAL | Status: DC

## 2013-07-14 NOTE — Progress Notes (Signed)
Subjective:    Patient ID: [name] Herman, male    DOB: Jul 05, 1960, 53 y.o.   MRN: [id number]  HPI Patient seen for ER followup. He was seen there on 07/11/2013 with relatively mild chest pain. Patient had episode that night of some anxiety followed by left substernal chest pressure. He has had somewhat chronic intermittent chest pains which are never exertional. He exercises regularly 4 days per week and has never had any  Pain, dizziness, or dyspnea with exercise. He had cardiac enzymes that were negative. Chest x-ray no acute abnormality.  Patient's past history is significant for episode of chest pain back in 2012 which prompted admission. He had a second troponin which was elevated at admission but repeat troponins were all negative. He had cardiac catheterization which revealed normal coronaries. Echocardiogram had revealed slightly diminished ejection fraction but catheterization normal coronaries with ejection fraction 65%. Patient exercises most days of the week. He does have history of GERD controlled with protonix.  On previous admission back in 2012 he had ultrasound which revealed gallstones and gallbladder nuclear study which was unremarkable.  He is a nonsmoker. No family history of premature CAD. Recent thyroid function normal.  Past Medical History  Diagnosis Date  . Chest pain     Cardiac catheterization 07/07/10: Normal coronary arteries; EF 65%;                             GI workup inconclusive with a normal abdominal ultrasound and normal HIDA scan  . Mobitz (type) I (Wenckebach's) atrioventricular block     Documented during sleep during admission for chest pain 07/06/10 to 07/10/10  . Asymptomatic LV dysfunction     Echocardiogram 07/08/10: EF 40-45% with normal wall motion; question athlete's heart  . Myocardial infarction 07-06-2010   Past Surgical History  Procedure Laterality Date  . None      reports that he quit smoking about 32 years ago. His smoking use  included Cigarettes. He has a 1.5 pack-year smoking history. He does not have any smokeless tobacco history on file. His alcohol and drug histories are not on file. family history includes Cancer in his paternal grandfather; Diabetes in his maternal grandfather; Heart disease in his father; Hyperlipidemia in his father, maternal grandfather, maternal grandmother, mother, paternal grandfather, and paternal grandmother; Stroke in his father. No Known Allergies    Review of Systems  Constitutional: Negative for fatigue and unexpected weight change.  Eyes: Negative for visual disturbance.  Respiratory: Negative for cough, chest tightness and shortness of breath.   Cardiovascular: Negative for chest pain, palpitations and leg swelling.  Neurological: Negative for dizziness, syncope, weakness, light-headedness and headaches.       Objective:   Physical Exam  Constitutional: He appears well-developed and well-nourished.  Neck: Neck supple. No thyromegaly present.  Cardiovascular: Normal rate and regular rhythm.   Pulmonary/Chest: Effort normal and breath sounds normal. No respiratory distress. He has no wheezes. He has no rales.  Abdominal: Soft. Bowel sounds are normal. He exhibits no distension and no mass. There is no tenderness. There is no rebound and no guarding.  Musculoskeletal: He exhibits no edema.          Assessment & Plan:  Intermittent chest pain. Prior workup as above unrevealing. Recent ED evaluation unremarkable. He is not describing classic GERD symptoms. His fleeting chest discomfort is always left-sided and never exertional. Previous abdominal ultrasound unremarkable. We discussed further GI workup with  endoscopy but he does not have any red flags such as appetite or weight changes any dysphagia or odynophagia. We've recommended observation this time.  Continue with PPI therapy.

## 2013-07-14 NOTE — Patient Instructions (Signed)
Follow up immediately for any exertional chest pain or dizziness We will call you regarding colonoscopy.

## 2013-07-14 NOTE — Progress Notes (Signed)
Pre visit review using our clinic review tool, if applicable. No additional management support is needed unless otherwise documented below in the visit note. 

## 2013-07-18 ENCOUNTER — Other Ambulatory Visit (INDEPENDENT_AMBULATORY_CARE_PROVIDER_SITE_OTHER): Payer: 59

## 2013-07-18 DIAGNOSIS — Z0000 Encounter for general adult medical examination without abnormal findings: Secondary | ICD-10-CM

## 2013-07-18 LAB — LIPID PANEL
CHOL/HDL RATIO: 4
Cholesterol: 178 mg/dL (ref 0–200)
HDL: 41.5 mg/dL (ref >39.00)
LDL CALC: 123 mg/dL — AB (ref 0–99)
Triglycerides: 70 mg/dL (ref 0.0–149.0)
VLDL: 14 mg/dL (ref 0.0–40.0)

## 2013-07-18 LAB — PSA: PSA: 0.41 ng/mL (ref 0.10–4.00)

## 2013-07-18 LAB — CBC WITH DIFFERENTIAL/PLATELET
Basophils Absolute: 0 10*3/uL (ref 0.0–0.1)
Basophils Relative: 0.6 % (ref 0.0–3.0)
Eosinophils Absolute: 0.2 10*3/uL (ref 0.0–0.7)
Eosinophils Relative: 2.9 % (ref 0.0–5.0)
HCT: 44.5 % (ref 39.0–52.0)
Hemoglobin: 14.8 g/dL (ref 13.0–17.0)
Lymphocytes Relative: 29.8 % (ref 12.0–46.0)
Lymphs Abs: 2 10*3/uL (ref 0.7–4.0)
MCHC: 33.2 g/dL (ref 30.0–36.0)
MCV: 98 fl (ref 78.0–100.0)
Monocytes Absolute: 0.6 10*3/uL (ref 0.1–1.0)
Monocytes Relative: 9.2 % (ref 3.0–12.0)
Neutro Abs: 3.8 10*3/uL (ref 1.4–7.7)
Neutrophils Relative %: 57.5 % (ref 43.0–77.0)
Platelets: 363 10*3/uL (ref 150.0–400.0)
RBC: 4.53 Mil/uL (ref 4.22–5.81)
RDW: 13 % (ref 11.5–14.6)
WBC: 6.6 10*3/uL (ref 4.5–10.5)

## 2013-07-18 LAB — BASIC METABOLIC PANEL
BUN: 17 mg/dL (ref 6–23)
CHLORIDE: 103 meq/L (ref 96–112)
CO2: 27 mEq/L (ref 19–32)
Calcium: 9.4 mg/dL (ref 8.4–10.5)
Creatinine, Ser: 1.2 mg/dL (ref 0.4–1.5)
GFR: 67.35 mL/min (ref >60.00)
Glucose, Bld: 88 mg/dL (ref 70–99)
Potassium: 3.7 mEq/L (ref 3.5–5.1)
SODIUM: 136 meq/L (ref 135–145)

## 2013-07-18 LAB — POCT URINALYSIS DIPSTICK
Bilirubin, UA: NEGATIVE
Glucose, UA: NEGATIVE
Ketones, UA: NEGATIVE
Leukocytes, UA: NEGATIVE
Nitrite, UA: NEGATIVE
Protein, UA: NEGATIVE
Spec Grav, UA: 1.025
Urobilinogen, UA: 0.2
pH, UA: 5.5

## 2013-07-18 LAB — HEPATIC FUNCTION PANEL
ALT: 17 U/L (ref 0–53)
AST: 16 U/L (ref 0–37)
Albumin: 4.3 g/dL (ref 3.5–5.2)
Alkaline Phosphatase: 41 U/L (ref 39–117)
Bilirubin, Direct: 0.1 mg/dL (ref 0.0–0.3)
Total Bilirubin: 0.9 mg/dL (ref 0.3–1.2)
Total Protein: 7.1 g/dL (ref 6.0–8.3)

## 2013-07-18 LAB — TSH: TSH: 0.87 u[IU]/mL (ref 0.35–5.50)

## 2013-07-25 ENCOUNTER — Ambulatory Visit (INDEPENDENT_AMBULATORY_CARE_PROVIDER_SITE_OTHER): Payer: 59 | Admitting: Family Medicine

## 2013-07-25 ENCOUNTER — Encounter: Payer: Self-pay | Admitting: Family Medicine

## 2013-07-25 VITALS — BP 128/80 | HR 73 | Ht 73.0 in | Wt 209.0 lb

## 2013-07-25 DIAGNOSIS — Z0000 Encounter for general adult medical examination without abnormal findings: Secondary | ICD-10-CM

## 2013-07-25 DIAGNOSIS — R319 Hematuria, unspecified: Secondary | ICD-10-CM

## 2013-07-25 LAB — POCT URINALYSIS DIPSTICK
Bilirubin, UA: NEGATIVE
GLUCOSE UA: NEGATIVE
KETONES UA: NEGATIVE
Leukocytes, UA: NEGATIVE
Nitrite, UA: NEGATIVE
Protein, UA: NEGATIVE
RBC UA: NEGATIVE
SPEC GRAV UA: 1.02
Urobilinogen, UA: 0.2
pH, UA: 6.5

## 2013-07-25 NOTE — Addendum Note (Signed)
Addended by: Marcina Millard on: 07/25/2013 10:51 AM   Modules accepted: Orders

## 2013-07-25 NOTE — Patient Instructions (Signed)
Confirm date of last tetanus We will call you regarding screening colonoscopy

## 2013-07-25 NOTE — Progress Notes (Signed)
Pre visit review using our clinic review tool, if applicable. No additional management support is needed unless otherwise documented below in the visit note. 

## 2013-07-25 NOTE — Progress Notes (Signed)
Subjective:    Patient ID: [name] Herman, male    DOB: Oct 14, 1960, 53 y.o.   MRN: [id number]  HPI Patient seen for complete physical. Patient has past history of chest pain with normal cardiac catheterization in 2012. Exercises several is per week without difficulty. Last tetanus estimated less than 10 years ago but no confirm date. He turned 52 this year. No previous colonoscopy. He takes no regular medications. Nonsmoker. Father had atrial fibrillation but no family history of premature CAD.  Past Medical History  Diagnosis Date  . Chest pain     Cardiac catheterization 07/07/10: Normal coronary arteries; EF 65%;                             GI workup inconclusive with a normal abdominal ultrasound and normal HIDA scan  . Mobitz (type) I (Wenckebach's) atrioventricular block     Documented during sleep during admission for chest pain 07/06/10 to 07/10/10  . Asymptomatic LV dysfunction     Echocardiogram 07/08/10: EF 40-45% with normal wall motion; question athlete's heart  . Myocardial infarction 07-06-2010   Past Surgical History  Procedure Laterality Date  . None      reports that he quit smoking about 33 years ago. His smoking use included Cigarettes. He has a 1.5 pack-year smoking history. He does not have any smokeless tobacco history on file. His alcohol and drug histories are not on file. family history includes Cancer in his paternal grandfather; Diabetes in his maternal grandfather; Heart disease (age of onset: 47) in his father; Hyperlipidemia in his father, maternal grandfather, maternal grandmother, mother, paternal grandfather, and paternal grandmother; Stroke in his father. No Known Allergies    Review of Systems  Constitutional: Negative for fever, activity change, appetite change and fatigue.  HENT: Negative for congestion, ear pain and trouble swallowing.   Eyes: Negative for pain and visual disturbance.  Respiratory: Negative for cough, shortness of breath and  wheezing.   Cardiovascular: Negative for chest pain and palpitations.  Gastrointestinal: Negative for nausea, vomiting, abdominal pain, diarrhea, constipation, blood in stool, abdominal distention and rectal pain.  Endocrine: Negative for polydipsia and polyuria.  Genitourinary: Negative for dysuria, hematuria and testicular pain.  Musculoskeletal: Negative for arthralgias and joint swelling.  Skin: Negative for rash.  Neurological: Negative for dizziness, syncope and headaches.  Hematological: Negative for adenopathy.  Psychiatric/Behavioral: Negative for confusion and dysphoric mood.       Objective:   Physical Exam  Constitutional: He is oriented to person, place, and time. He appears well-developed and well-nourished. No distress.  HENT:  Head: Normocephalic and atraumatic.  Right Ear: External ear normal.  Left Ear: External ear normal.  Mouth/Throat: Oropharynx is clear and moist.  Eyes: Conjunctivae and EOM are normal. Pupils are equal, round, and reactive to light.  Neck: Normal range of motion. Neck supple. No thyromegaly present.  Cardiovascular: Normal rate, regular rhythm and normal heart sounds.   No murmur heard. Pulmonary/Chest: No respiratory distress. He has no wheezes. He has no rales.  Abdominal: Soft. Bowel sounds are normal. He exhibits no distension and no mass. There is no tenderness. There is no rebound and no guarding.  Musculoskeletal: He exhibits no edema.  Lymphadenopathy:    He has no cervical adenopathy.  Neurological: He is alert and oriented to person, place, and time. He displays normal reflexes. No cranial nerve deficit.  Skin: No rash noted.  Psychiatric: He has a normal  mood and affect.          Assessment & Plan:  Complete physical. Labs reviewed. He has trace blood on urine dipstick and repeat this today and urine micro-if still positive. Set up screening colonoscopy. Confirm date of last tetanus.

## 2013-08-28 ENCOUNTER — Encounter: Payer: Self-pay | Admitting: Family Medicine

## 2014-03-01 ENCOUNTER — Other Ambulatory Visit: Payer: Self-pay

## 2014-03-01 MED ORDER — ESOMEPRAZOLE MAGNESIUM 40 MG PO CPDR: 40.0000 mg | DELAYED_RELEASE_CAPSULE | Freq: Every day | ORAL | Status: DC

## 2014-05-18 HISTORY — PX: COLONOSCOPY: SHX174

## 2014-06-04 ENCOUNTER — Encounter: Payer: Self-pay | Admitting: Internal Medicine

## 2014-06-28 ENCOUNTER — Ambulatory Visit (INDEPENDENT_AMBULATORY_CARE_PROVIDER_SITE_OTHER): Payer: 59 | Admitting: Family Medicine

## 2014-06-28 ENCOUNTER — Encounter: Payer: Self-pay | Admitting: Family Medicine

## 2014-06-28 VITALS — BP 126/80 | HR 74 | Temp 97.6°F | Wt 198.0 lb

## 2014-06-28 DIAGNOSIS — J069 Acute upper respiratory infection, unspecified: Secondary | ICD-10-CM

## 2014-06-28 NOTE — Progress Notes (Signed)
   Subjective:    Patient ID: [name] Herman, male    DOB: [birth year], 54 y.o.   MRN: [id number]  HPI Patient seen for acute visit. He developed about 5 days ago some sinus congestion and intermittent headaches. Some increased malaise. Denies any cough or fever. He's been using some Mucinex and Sudafed and nasal saline irrigation and seems to be somewhat better today compared with yesterday. Denies any upper teeth pain.  Past Medical History  Diagnosis Date  . Chest pain     Cardiac catheterization 07/07/10: Normal coronary arteries; EF 65%;                             GI workup inconclusive with a normal abdominal ultrasound and normal HIDA scan  . Mobitz (type) I (Wenckebach's) atrioventricular block     Documented during sleep during admission for chest pain 07/06/10 to 07/10/10  . Asymptomatic LV dysfunction     Echocardiogram 07/08/10: EF 40-45% with normal wall motion; question athlete's heart  . Myocardial infarction 07-06-2010   Past Surgical History  Procedure Laterality Date  . None      reports that he quit smoking about 33 years ago. His smoking use included Cigarettes. He has a 1.5 pack-year smoking history. He does not have any smokeless tobacco history on file. His alcohol and drug histories are not on file. family history includes Cancer in his paternal grandfather; Diabetes in his maternal grandfather; Heart disease (age of onset: 5) in his father; Hyperlipidemia in his father, maternal grandfather, maternal grandmother, mother, paternal grandfather, and paternal grandmother; Stroke in his father. No Known Allergies    Review of Systems  Constitutional: Positive for fatigue.  HENT: Positive for congestion and sinus pressure. Negative for sore throat.   Neurological: Positive for headaches.       Objective:   Physical Exam  Constitutional: He appears well-developed and well-nourished.  HENT:  Right Ear: External ear normal.  Left Ear: External ear normal.    Mouth/Throat: Oropharynx is clear and moist.  Nasal mucosa slightly erythematous, otherwise clear  Neck: Neck supple.  Cardiovascular: Normal rate and regular rhythm.   Pulmonary/Chest: Effort normal and breath sounds normal. No respiratory distress. He has no wheezes. He has no rales.  Lymphadenopathy:    He has no cervical adenopathy.          Assessment & Plan:  URI. Suspect viral. We did not recommend antibiotics at this time. Continue Mucinex, Sudafed, nasal saline irrigation. Follow-up as needed

## 2014-06-28 NOTE — Progress Notes (Signed)
Pre visit review using our clinic review tool, if applicable. No additional management support is needed unless otherwise documented below in the visit note. 

## 2014-06-28 NOTE — Patient Instructions (Signed)
Upper Respiratory Infection, Adult An upper respiratory infection (URI) is also sometimes known as the common cold. The upper respiratory tract includes the nose, sinuses, throat, trachea, and bronchi. Bronchi are the airways leading to the lungs. Most people improve within 1 week, but symptoms can last up to 2 weeks. A residual cough may last even longer.  CAUSES Many different viruses can infect the tissues lining the upper respiratory tract. The tissues become irritated and inflamed and often become very moist. Mucus production is also common. A cold is contagious. You can easily spread the virus to others by oral contact. This includes kissing, sharing a glass, coughing, or sneezing. Touching your mouth or nose and then touching a surface, which is then touched by another person, can also spread the virus. SYMPTOMS  Symptoms typically develop 1 to 3 days after you come in contact with a cold virus. Symptoms vary from person to person. They may include:  Runny nose.  Sneezing.  Nasal congestion.  Sinus irritation.  Sore throat.  Loss of voice (laryngitis).  Cough.  Fatigue.  Muscle aches.  Loss of appetite.  Headache.  Low-grade fever. DIAGNOSIS  You might diagnose your own cold based on familiar symptoms, since most people get a cold 2 to 3 times a year. Your caregiver can confirm this based on your exam. Most importantly, your caregiver can check that your symptoms are not due to another disease such as strep throat, sinusitis, pneumonia, asthma, or epiglottitis. Blood tests, throat tests, and X-rays are not necessary to diagnose a common cold, but they may sometimes be helpful in excluding other more serious diseases. Your caregiver will decide if any further tests are required. RISKS AND COMPLICATIONS  You may be at risk for a more severe case of the common cold if you smoke cigarettes, have chronic heart disease (such as heart failure) or lung disease (such as asthma), or if  you have a weakened immune system. The very young and very old are also at risk for more serious infections. Bacterial sinusitis, middle ear infections, and bacterial pneumonia can complicate the common cold. The common cold can worsen asthma and chronic obstructive pulmonary disease (COPD). Sometimes, these complications can require emergency medical care and may be life-threatening. PREVENTION  The best way to protect against getting a cold is to practice good hygiene. Avoid oral or hand contact with people with cold symptoms. Wash your hands often if contact occurs. There is no clear evidence that vitamin C, vitamin E, echinacea, or exercise reduces the chance of developing a cold. However, it is always recommended to get plenty of rest and practice good nutrition. TREATMENT  Treatment is directed at relieving symptoms. There is no cure. Antibiotics are not effective, because the infection is caused by a virus, not by bacteria. Treatment may include:  Increased fluid intake. Sports drinks offer valuable electrolytes, sugars, and fluids.  Breathing heated mist or steam (vaporizer or shower).  Eating chicken soup or other clear broths, and maintaining good nutrition.  Getting plenty of rest.  Using gargles or lozenges for comfort.  Controlling fevers with ibuprofen or acetaminophen as directed by your caregiver.  Increasing usage of your inhaler if you have asthma. Zinc gel and zinc lozenges, taken in the first 24 hours of the common cold, can shorten the duration and lessen the severity of symptoms. Pain medicines may help with fever, muscle aches, and throat pain. A variety of non-prescription medicines are available to treat congestion and runny nose. Your caregiver   can make recommendations and may suggest nasal or lung inhalers for other symptoms.  HOME CARE INSTRUCTIONS   Only take over-the-counter or prescription medicines for pain, discomfort, or fever as directed by your  caregiver.  Use a warm mist humidifier or inhale steam from a shower to increase air moisture. This may keep secretions moist and make it easier to breathe.  Drink enough water and fluids to keep your urine clear or pale yellow.  Rest as needed.  Return to work when your temperature has returned to normal or as your caregiver advises. You may need to stay home longer to avoid infecting others. You can also use a face mask and careful hand washing to prevent spread of the virus. SEEK MEDICAL CARE IF:   After the first few days, you feel you are getting worse rather than better.  You need your caregiver's advice about medicines to control symptoms.  You develop chills, worsening shortness of breath, or brown or red sputum. These may be signs of pneumonia.  You develop yellow or brown nasal discharge or pain in the face, especially when you bend forward. These may be signs of sinusitis.  You develop a fever, swollen neck glands, pain with swallowing, or white areas in the back of your throat. These may be signs of strep throat. SEEK IMMEDIATE MEDICAL CARE IF:   You have a fever.  You develop severe or persistent headache, ear pain, sinus pain, or chest pain.  You develop wheezing, a prolonged cough, cough up blood, or have a change in your usual mucus (if you have chronic lung disease).  You develop sore muscles or a stiff neck. Document Released: 10/28/2000 Document Revised: 07/27/2011 Document Reviewed: 08/09/2013 Exit[employer] Patient Information 2015 [employer]. This information is not intended to replace advice given to you by your health care provider. Make sure you discuss any questions you have with your health care provider.  

## 2014-08-07 ENCOUNTER — Other Ambulatory Visit: Payer: Self-pay | Admitting: Family Medicine

## 2014-08-08 ENCOUNTER — Ambulatory Visit (AMBULATORY_SURGERY_CENTER): Payer: Self-pay

## 2014-08-08 VITALS — Ht 73.0 in | Wt 195.0 lb

## 2014-08-08 DIAGNOSIS — Z800 Family history of malignant neoplasm of digestive organs: Secondary | ICD-10-CM

## 2014-08-08 NOTE — Progress Notes (Signed)
No allergies to eggs or soy No diet/weight loss meds No home oxygen No past problems with anesthesia  Has email  Emmi instructions given for colonoscopy 

## 2014-08-24 ENCOUNTER — Ambulatory Visit (AMBULATORY_SURGERY_CENTER): Payer: 59 | Admitting: Internal Medicine

## 2014-08-24 ENCOUNTER — Encounter: Payer: Self-pay | Admitting: Internal Medicine

## 2014-08-24 VITALS — BP 105/65 | HR 67 | Temp 97.9°F | Resp 23 | Ht 73.0 in | Wt 195.0 lb

## 2014-08-24 DIAGNOSIS — D120 Benign neoplasm of cecum: Secondary | ICD-10-CM

## 2014-08-24 DIAGNOSIS — Z1211 Encounter for screening for malignant neoplasm of colon: Secondary | ICD-10-CM | POA: Diagnosis not present

## 2014-08-24 MED ORDER — SODIUM CHLORIDE 0.9 % IV SOLN: 500.0000 mL | INTRAVENOUS | Status: DC

## 2014-08-24 NOTE — Op Note (Signed)
[place]  [address] [place], [id number]   COLONOSCOPY PROCEDURE REPORT  PATIENT: [name] Herman, [name] Herman  MR#: [id number] BIRTHDATE: November 01, 1960 , 53  yrs. old GENDER: male ENDOSCOPIST: Gatha Mayer, MD, [facility] PROCEDURE DATE:  08/24/2014 PROCEDURE:   Colonoscopy, screening and Colonoscopy with snare polypectomy First Screening Colonoscopy - Avg.  risk and is 50 yrs.  old or older Yes.  Prior Negative Screening - Now for repeat screening. N/A  History of Adenoma - Now for follow-up colonoscopy & has been > or = to 3 yrs.  N/A ASA CLASS:   Class II INDICATIONS:Screening for colonic neoplasia and Colorectal Neoplasm Risk Assessment for this procedure is average risk. MEDICATIONS: Propofol 300 mg IV  DESCRIPTION OF PROCEDURE:   After the risks benefits and alternatives of the procedure were thoroughly explained, informed consent was obtained.  The digital rectal exam revealed no abnormalities of the rectum, revealed no prostatic nodules, and revealed the prostate was not enlarged.   The [id number] endoscope was introduced through the anus and advanced to the cecum, which was identified by both the appendix and ileocecal valve. No adverse events experienced.   The quality of the prep was good.  (MiraLax was used)  The instrument was then slowly withdrawn as the colon was fully examined.   COLON FINDINGS: A sessile polyp measuring 12 mm in size was found at the cecum.  A polypectomy was performed using snare cautery.  The resection was complete, the polyp tissue was completely retrieved and sent to histology.   There was moderate diverticulosis noted in the sigmoid colon.   The examination was otherwise normal. Retroflexed views revealed no abnormalities. The time to cecum = 2.5 Withdrawal time = 9.8   The scope was withdrawn and the procedure completed. COMPLICATIONS: There were no immediate complications.  ENDOSCOPIC IMPRESSION: 1.   Sessile polyp was  found at the cecum; polypectomy was performed using snare cautery 2.   Moderate diverticulosis was noted in the sigmoid colon 3.   The examination was otherwise normal - good prep - first screening  RECOMMENDATIONS: 1.  Hold Aspirin and all other NSAIDS for 2 weeks. 2.  Timing of repeat colonoscopy will be determined by pathology findings.  eSigned:  Gatha Mayer, MD, [facility] 08/24/2014 11:54 AM   cc: The Patient  and Carolann Littler, MD

## 2014-08-24 NOTE — Patient Instructions (Addendum)
I found and removed one polyp. It looks benign but I bet you will need to return for another colonoscopy in about 3 years.  You also have a condition called diverticulosis - common and not usually a problem. Please read the handout provided.  I will let you know pathology results and when to have another routine colonoscopy by mail.  I appreciate the opportunity to care for you. Gatha Mayer, MD, FACG  YOU HAD AN ENDOSCOPIC PROCEDURE TODAY AT [facility]:   Refer to the procedure report that was given to you for any specific questions about what was found during the examination.  If the procedure report does not answer your questions, please call your gastroenterologist to clarify.  If you requested that your care partner not be given the details of your procedure findings, then the procedure report has been included in a sealed envelope for you to review at your convenience later.  YOU SHOULD EXPECT: Some feelings of bloating in the abdomen. Passage of more gas than usual.  Walking can help get rid of the air that was put into your GI tract during the procedure and reduce the bloating. If you had a lower endoscopy (such as a colonoscopy or flexible sigmoidoscopy) you may notice spotting of blood in your stool or on the toilet paper. If you underwent a bowel prep for your procedure, you may not have a normal bowel movement for a few days.  Please Note:  You might notice some irritation and congestion in your nose or some drainage.  This is from the oxygen used during your procedure.  There is no need for concern and it should clear up in a day or so.  SYMPTOMS TO REPORT IMMEDIATELY:   Following lower endoscopy (colonoscopy or flexible sigmoidoscopy):  Excessive amounts of blood in the stool  Significant tenderness or worsening of abdominal pains  Swelling of the abdomen that is new, acute  Fever of 100F or higher  For urgent or emergent issues, a  gastroenterologist can be reached at any hour by calling [phone].   DIET: Your first meal following the procedure should be a small meal and then it is ok to progress to your normal diet. Heavy or fried foods are harder to digest and may make you feel nauseous or bloated.  Likewise, meals heavy in dairy and vegetables can increase bloating.  Drink plenty of fluids but you should avoid alcoholic beverages for 24 hours.  ACTIVITY:  You should plan to take it easy for the rest of today and you should NOT DRIVE or use heavy machinery until tomorrow (because of the sedation medicines used during the test).    FOLLOW UP: Our staff will call the number listed on your records the next business day following your procedure to check on you and address any questions or concerns that you may have regarding the information given to you following your procedure. If we do not reach you, we will leave a message.  However, if you are feeling well and you are not experiencing any problems, there is no need to return our call.  We will assume that you have returned to your regular daily activities without incident.  If any biopsies were taken you will be contacted by phone or by letter within the next 1-3 weeks.  Please call us at [phone] if you have not heard about the biopsies in 3 weeks.    SIGNATURES/CONFIDENTIALITY: You and/or your care partner  have signed paperwork which will be entered into your electronic medical record.  These signatures attest to the fact that that the information above on your After Visit Summary has been reviewed and is understood.  Full responsibility of the confidentiality of this discharge information lies with you and/or your care-partner.  Continue your normal medications

## 2014-08-24 NOTE — Progress Notes (Signed)
To recovery, report to Scott, RN, VSS 

## 2014-08-24 NOTE — Progress Notes (Signed)
Called to room to assist during endoscopic procedure.  Patient ID and intended procedure confirmed with present staff. Received instructions for my participation in the procedure from the performing physician.  

## 2014-08-27 ENCOUNTER — Telehealth: Payer: Self-pay | Admitting: *Deleted

## 2014-08-27 NOTE — Telephone Encounter (Signed)
  Follow up Call-  Call back number 08/24/2014  Post procedure Call Back phone  # [phone]  Permission to leave phone message Yes     Patient questions:  Do you have a fever, pain , or abdominal swelling? No. Pain Score  0 *  Have you tolerated food without any problems? Yes.    Have you been able to return to your normal activities? Yes.    Do you have any questions about your discharge instructions: Diet   No. Medications  No. Follow up visit  No.  Do you have questions or concerns about your Care? No.  Actions: * If pain score is 4 or above: No action needed, pain <4.

## 2014-08-30 ENCOUNTER — Encounter: Payer: Self-pay | Admitting: Internal Medicine

## 2014-08-30 DIAGNOSIS — Z86010 Personal history of colonic polyps: Secondary | ICD-10-CM

## 2014-08-30 DIAGNOSIS — Z860101 Personal history of adenomatous and serrated colon polyps: Secondary | ICD-10-CM

## 2014-08-30 HISTORY — DX: Personal history of colonic polyps: Z86.010

## 2014-08-30 HISTORY — DX: Personal history of adenomatous and serrated colon polyps: Z86.0101

## 2014-08-30 NOTE — Progress Notes (Signed)
Quick Note:  12 mm adenoma so repeat colonoscopy 2019 ______

## 2014-09-24 ENCOUNTER — Encounter: Payer: Self-pay | Admitting: Family Medicine

## 2014-09-24 ENCOUNTER — Ambulatory Visit (INDEPENDENT_AMBULATORY_CARE_PROVIDER_SITE_OTHER): Payer: 59 | Admitting: Family Medicine

## 2014-09-24 VITALS — BP 104/70 | Ht 73.0 in | Wt 190.0 lb

## 2014-09-24 DIAGNOSIS — S76011A Strain of muscle, fascia and tendon of right hip, initial encounter: Secondary | ICD-10-CM

## 2014-09-24 DIAGNOSIS — M545 Low back pain, unspecified: Secondary | ICD-10-CM

## 2014-09-24 DIAGNOSIS — S76311A Strain of muscle, fascia and tendon of the posterior muscle group at thigh level, right thigh, initial encounter: Secondary | ICD-10-CM

## 2014-09-24 NOTE — Patient Instructions (Signed)
I think you have a psoas muscle strain on the right, and you have some low back stiffness that is compensatory. I would STOP any crunches, sit ups or abdominal exercises for the next 2-3 weeks. I would ADD some low back stretches and strengthening. If you are not improving in 2-3 weeks, please return to clinic.

## 2014-09-25 DIAGNOSIS — S76019A Strain of muscle, fascia and tendon of unspecified hip, initial encounter: Secondary | ICD-10-CM | POA: Insufficient documentation

## 2014-09-25 NOTE — Assessment & Plan Note (Signed)
Given his report of doing his lunges free-style with dumbbells rather than with the aid of the squat rack, I think he changed his form, probably elongating the posas with each lunge more than typical.  He's also evidently been working pretty hard on the abdominal crunch machine (which sounds like it's got 3 different positions--activates elongated muscle). Whichever activity was instigator,  I think he has a mild psoas  strain. I think the low back pain is a result of postural changes to take the pressure off his psas muscle, as he stands for most of the day.  We'll take him out of abdominal core work for the next couple of weeks, decrease intensity of any lunges. We will work  little bit on low back strengthening and stretching (quadraped and half superman, HO given and discussed; do not hyperextend leg in quadraped) .   If he's not improving I wanted to come back because as we discussed so as muscle pain could also be indicative of other pathologies, appendicitis, psoas abscess,  etc. Certainly he doesn't have any of those symptoms at this time.

## 2014-09-26 NOTE — Progress Notes (Signed)
Patient ID: [name] Herman, male   DOB: [birth year], 54 y.o.   MRN: [id number]  [name] Herman - 54 y.o. male MRN [id number]  Date of birth: May 24, 1960    SUBJECTIVE:     Several days of right groin pain and bilateral low back pain. Noticed that it started after a workout where he did his lunges freestyle with dumbbells rather than using the squat rack. Did not have any pain at the time. The next day he noticed some vague right groin pain and over the next few days started having midline low back pain. The back pain was nonradiating. Worse after long periods of standing. He has continued to work out and does not have pain during her workout. Pain in the right groin area is diffuse, not really worse with any specific movement, at worst 4 out of 10. The pain in the back is midline and bilaterally a few inches on each side from the spine, about the level of the top of his hip bones posteriorly. 2-5 out of 10.  Works out regularly. ROS:     No radiation of the back pain. No incontinence of bowel or bladder. No weakness or sensory weakness of his legs, no giving way of the legs, no pain on walking. He's not had any unusual weight change, fever, sweats, chills. No unusual urinary symptoms.  PERTINENT  PMH / PSH FH / / SH:  Past Medical, Surgical, Social, and Family History Reviewed & Updated in the EMR.  Pertinent findings include:  History of myocardial infarction, left ventricular dysfunction, Mobitz type I AV block.  OBJECTIVE: BP 104/70 mmHg  Ht 6\' 1"  (1.854 m)  Wt 190 lb (86.183 kg)  BMI 25.07 kg/m2  Physical Exam:  Vital signs are reviewed. GEN.: Well-developed male no acute distress BACK: No defect, symmetrical. Area of pain is at the level of the PSIS. It extends about 2 inches to the side of the spine bilaterally. Mildly tender to palpation. Some vague tenderness to palpation over the PSIS. Flexion at the hips is limited by stiffness in the low back as well as hamstring stiffness. No pain with  hyperextension and range of motion is normal. Rotation painless, symmetrical, normal range of motion. HIPS: Internal and external rotation bilaterally full, painless. Mild tender to palpation over the insertion of the so as. Positive Ludlow, positive psoas test on the right. Hip flexion extension strength 5 out of 5, knee flexion extension 5 out of 5, dorsiflexion plantarflexion 5 out of 5. Negative Trendelenburg.  ASSESSMENT & PLAN:  See problem based charting & AVS for pt instructions.

## 2014-11-07 ENCOUNTER — Other Ambulatory Visit: Payer: Self-pay

## 2014-11-07 ENCOUNTER — Encounter: Payer: Self-pay | Admitting: Family Medicine

## 2014-11-07 MED ORDER — PIMECROLIMUS 1 % EX CREA: 1.0000 "application " | TOPICAL_CREAM | Freq: Two times a day (BID) | CUTANEOUS | Status: DC | PRN

## 2015-03-26 ENCOUNTER — Other Ambulatory Visit: Payer: Self-pay | Admitting: *Deleted

## 2015-03-26 MED ORDER — MELOXICAM 15 MG PO TABS: 15.0000 mg | ORAL_TABLET | Freq: Every day | ORAL | Status: DC

## 2015-08-28 ENCOUNTER — Other Ambulatory Visit: Payer: Self-pay | Admitting: Sports Medicine

## 2015-08-28 ENCOUNTER — Other Ambulatory Visit: Payer: Self-pay | Admitting: Family Medicine

## 2015-08-28 MED FILL — MELOXICAM 15 MG TABLET: 15 | 30 days supply | Qty: 30 | Fill #0

## 2015-08-28 MED FILL — PANTOPRAZOLE SOD DR 40 MG T: 40 | 90 days supply | Qty: 90 | Fill #0

## 2015-10-16 ENCOUNTER — Encounter: Payer: Self-pay | Admitting: Family Medicine

## 2015-10-16 NOTE — Telephone Encounter (Signed)
FYI

## 2015-10-23 ENCOUNTER — Encounter: Payer: Self-pay | Admitting: Family Medicine

## 2015-10-23 ENCOUNTER — Ambulatory Visit (INDEPENDENT_AMBULATORY_CARE_PROVIDER_SITE_OTHER): Payer: 59 | Admitting: Family Medicine

## 2015-10-23 VITALS — BP 120/82 | HR 65 | Temp 97.9°F | Ht 71.5 in | Wt 193.0 lb

## 2015-10-23 DIAGNOSIS — Z0000 Encounter for general adult medical examination without abnormal findings: Secondary | ICD-10-CM

## 2015-10-23 MED ORDER — ATOVAQUONE-PROGUANIL HCL 250-100 MG PO TABS: ORAL_TABLET | ORAL | Status: DC

## 2015-10-23 MED ORDER — TYPHOID VACCINE PO CPDR: 1.0000 | DELAYED_RELEASE_CAPSULE | ORAL | Status: DC

## 2015-10-23 MED ORDER — CIPROFLOXACIN HCL 500 MG PO TABS: 500.0000 mg | ORAL_TABLET | Freq: Two times a day (BID) | ORAL | Status: DC

## 2015-10-23 MED FILL — CIPROFLOXACIN HCL 500 MG TA: 500 | 6 days supply | Qty: 12 | Fill #0

## 2015-10-23 MED FILL — ATOVAQUONE-PROGUANIL 250-10: 250-100 | 18 days supply | Qty: 18 | Fill #0

## 2015-10-23 NOTE — Progress Notes (Signed)
Subjective:    Patient ID: [name] Herman, male    DOB: January 17, 1961, 55 y.o.   MRN: [id number]  HPI  Here for physical. Generally very healthy. Upcoming trip to [place] and has several questions regarding immunizations and medications. He thinks he's had previous hepatitis A. He thinks he's had tetanus in the past 10 years. He's had remote history of typhoid vaccination but this was over 5 years ago. He will need malaria prevention. We'll not need yellow fever prevention.  He has lost some weight due to his efforts at dietary change over the past year. Usually exercises 3-4 days per week. No recent chest pains. Nonsmoker. Has had previous colonoscopy. Generally feels well  Past Medical History  Diagnosis Date  . Chest pain     Cardiac catheterization 07/07/10: Normal coronary arteries; EF 65%;                             GI workup inconclusive with a normal abdominal ultrasound and normal HIDA scan  . Mobitz (type) I (Wenckebach's) atrioventricular block     Documented during sleep during admission for chest pain 07/06/10 to 07/10/10  . Asymptomatic LV dysfunction     Echocardiogram 07/08/10: EF 40-45% with normal wall motion; question athlete's heart  . Myocardial infarction ([place]) 07-06-2010  . Hx of adenomatous polyp of colon 08/30/2014   Past Surgical History  Procedure Laterality Date  . None    . Cardiac catheterization      reports that he quit smoking about 35 years ago. His smoking use included Cigarettes. He has a 1.5 pack-year smoking history. He has never used smokeless tobacco. He reports that he drinks about 1.2 oz of alcohol per week. He reports that he does not use illicit drugs. family history includes Cancer in his paternal grandfather; Colon cancer in his paternal grandfather; Diabetes in his maternal grandfather; Heart disease (age of onset: 64) in his father; Hyperlipidemia in his father, maternal grandfather, maternal grandmother, mother, paternal grandfather,  and paternal grandmother; Stroke in his father. No Known Allergies   Review of Systems  Constitutional: Negative for fever, activity change, appetite change and fatigue.  HENT: Negative for congestion, ear pain and trouble swallowing.   Eyes: Negative for pain and visual disturbance.  Respiratory: Negative for cough, shortness of breath and wheezing.   Cardiovascular: Negative for chest pain and palpitations.  Gastrointestinal: Negative for nausea, vomiting, abdominal pain, diarrhea, constipation, blood in stool, abdominal distention and rectal pain.  Genitourinary: Negative for dysuria, hematuria and testicular pain.  Musculoskeletal: Negative for joint swelling and arthralgias.  Skin: Negative for rash.  Neurological: Negative for dizziness, syncope and headaches.  Hematological: Negative for adenopathy.  Psychiatric/Behavioral: Negative for confusion and dysphoric mood.       Objective:   Physical Exam  Constitutional: He is oriented to person, place, and time. He appears well-developed and well-nourished. No distress.  HENT:  Head: Normocephalic and atraumatic.  Right Ear: External ear normal.  Left Ear: External ear normal.  Mouth/Throat: Oropharynx is clear and moist.  Eyes: Conjunctivae and EOM are normal. Pupils are equal, round, and reactive to light.  Neck: Normal range of motion. Neck supple. No thyromegaly present.  Cardiovascular: Normal rate, regular rhythm and normal heart sounds.   No murmur heard. Pulmonary/Chest: No respiratory distress. He has no wheezes. He has no rales.  Abdominal: Soft. Bowel sounds are normal. He exhibits no distension and no mass. There is  no tenderness. There is no rebound and no guarding.  Musculoskeletal: He exhibits no edema.  Lymphadenopathy:    He has no cervical adenopathy.  Neurological: He is alert and oriented to person, place, and time. He displays normal reflexes. No cranial nerve deficit.  Skin: No rash noted.  Psychiatric:  He has a normal mood and affect.          Assessment & Plan:  Physical exam. Obtain screening lab work. Continue regular exercise habits. Confirm previous tetanus and hepatitis A vaccinations. We provided prescription for Cipro, Malarone, and typhoid oral vaccination-for his upcoming trip to [place]. Marland Kitchen He does not have any contraindications to these.  Eulas Post MD [facility] at [facility]

## 2015-10-23 NOTE — Patient Instructions (Signed)
Confirm date of tetanus and ? Hx of Hep A

## 2015-10-24 ENCOUNTER — Encounter: Payer: Self-pay | Admitting: Family Medicine

## 2015-10-24 ENCOUNTER — Encounter: Payer: 59 | Admitting: Family Medicine

## 2015-10-24 LAB — CBC WITH DIFFERENTIAL/PLATELET
BASOS PCT: 0.6 % (ref 0.0–3.0)
Basophils Absolute: 0 10*3/uL (ref 0.0–0.1)
EOS ABS: 0.3 10*3/uL (ref 0.0–0.7)
Eosinophils Relative: 4.7 % (ref 0.0–5.0)
HCT: 42.7 % (ref 39.0–52.0)
HEMOGLOBIN: 14.4 g/dL (ref 13.0–17.0)
LYMPHS PCT: 30.4 % (ref 12.0–46.0)
Lymphs Abs: 2 10*3/uL (ref 0.7–4.0)
MCHC: 33.7 g/dL (ref 30.0–36.0)
MCV: 95.1 fl (ref 78.0–100.0)
Monocytes Absolute: 0.5 10*3/uL (ref 0.1–1.0)
Monocytes Relative: 8.2 % (ref 3.0–12.0)
NEUTROS PCT: 56.1 % (ref 43.0–77.0)
Neutro Abs: 3.7 10*3/uL (ref 1.4–7.7)
Platelets: 341 10*3/uL (ref 150.0–400.0)
RBC: 4.49 Mil/uL (ref 4.22–5.81)
RDW: 13 % (ref 11.5–15.5)
WBC: 6.6 10*3/uL (ref 4.0–10.5)

## 2015-10-24 LAB — BASIC METABOLIC PANEL
BUN: 24 mg/dL — AB (ref 6–23)
CHLORIDE: 101 meq/L (ref 96–112)
CO2: 31 meq/L (ref 19–32)
CREATININE: 1.15 mg/dL (ref 0.40–1.50)
Calcium: 9.6 mg/dL (ref 8.4–10.5)
GFR: 70.78 mL/min (ref >60.00)
GLUCOSE: 86 mg/dL (ref 70–99)
POTASSIUM: 4.6 meq/L (ref 3.5–5.1)
Sodium: 138 mEq/L (ref 135–145)

## 2015-10-24 LAB — HEPATIC FUNCTION PANEL
ALK PHOS: 41 U/L (ref 39–117)
ALT: 16 U/L (ref 0–53)
AST: 15 U/L (ref 0–37)
Albumin: 4.7 g/dL (ref 3.5–5.2)
BILIRUBIN DIRECT: 0.1 mg/dL (ref 0.0–0.3)
BILIRUBIN TOTAL: 0.4 mg/dL (ref 0.2–1.2)
Total Protein: 7.2 g/dL (ref 6.0–8.3)

## 2015-10-24 LAB — LIPID PANEL
CHOL/HDL RATIO: 4
Cholesterol: 204 mg/dL — ABNORMAL HIGH (ref 0–200)
HDL: 53.7 mg/dL (ref >39.00)
LDL CALC: 122 mg/dL — AB (ref 0–99)
NonHDL: 150.45
Triglycerides: 143 mg/dL (ref 0.0–149.0)
VLDL: 28.6 mg/dL (ref 0.0–40.0)

## 2015-10-24 LAB — PSA: PSA: 0.34 ng/mL (ref 0.10–4.00)

## 2015-10-24 LAB — TSH: TSH: 1.23 u[IU]/mL (ref 0.35–4.50)

## 2015-10-24 MED FILL — VIVOTIF EC CAPSULE: 8 days supply | Qty: 4 | Fill #0

## 2015-10-24 NOTE — Telephone Encounter (Signed)
Okay to schedule for Hep A? Any other recommendations?

## 2015-10-25 ENCOUNTER — Encounter: Payer: Self-pay | Admitting: Family Medicine

## 2015-10-28 MED FILL — MELOXICAM 15 MG TABLET: 15 | 30 days supply | Qty: 30 | Fill #1

## 2015-10-29 DIAGNOSIS — H353111 Nonexudative age-related macular degeneration, right eye, early dry stage: Secondary | ICD-10-CM | POA: Diagnosis not present

## 2015-10-29 DIAGNOSIS — H52223 Regular astigmatism, bilateral: Secondary | ICD-10-CM | POA: Diagnosis not present

## 2015-10-29 DIAGNOSIS — H524 Presbyopia: Secondary | ICD-10-CM | POA: Diagnosis not present

## 2015-10-29 DIAGNOSIS — H353121 Nonexudative age-related macular degeneration, left eye, early dry stage: Secondary | ICD-10-CM | POA: Diagnosis not present

## 2015-11-22 IMAGING — CR DG CHEST 2V
2 series · 2 of 2 positions shown · non-contrast
Comparison: Chest radiograph performed 07/06/2010

CLINICAL DATA: Chest pain and shortness of breath.  Hypertension.

EXAM:
CHEST  2 VIEW

[w chest pa]
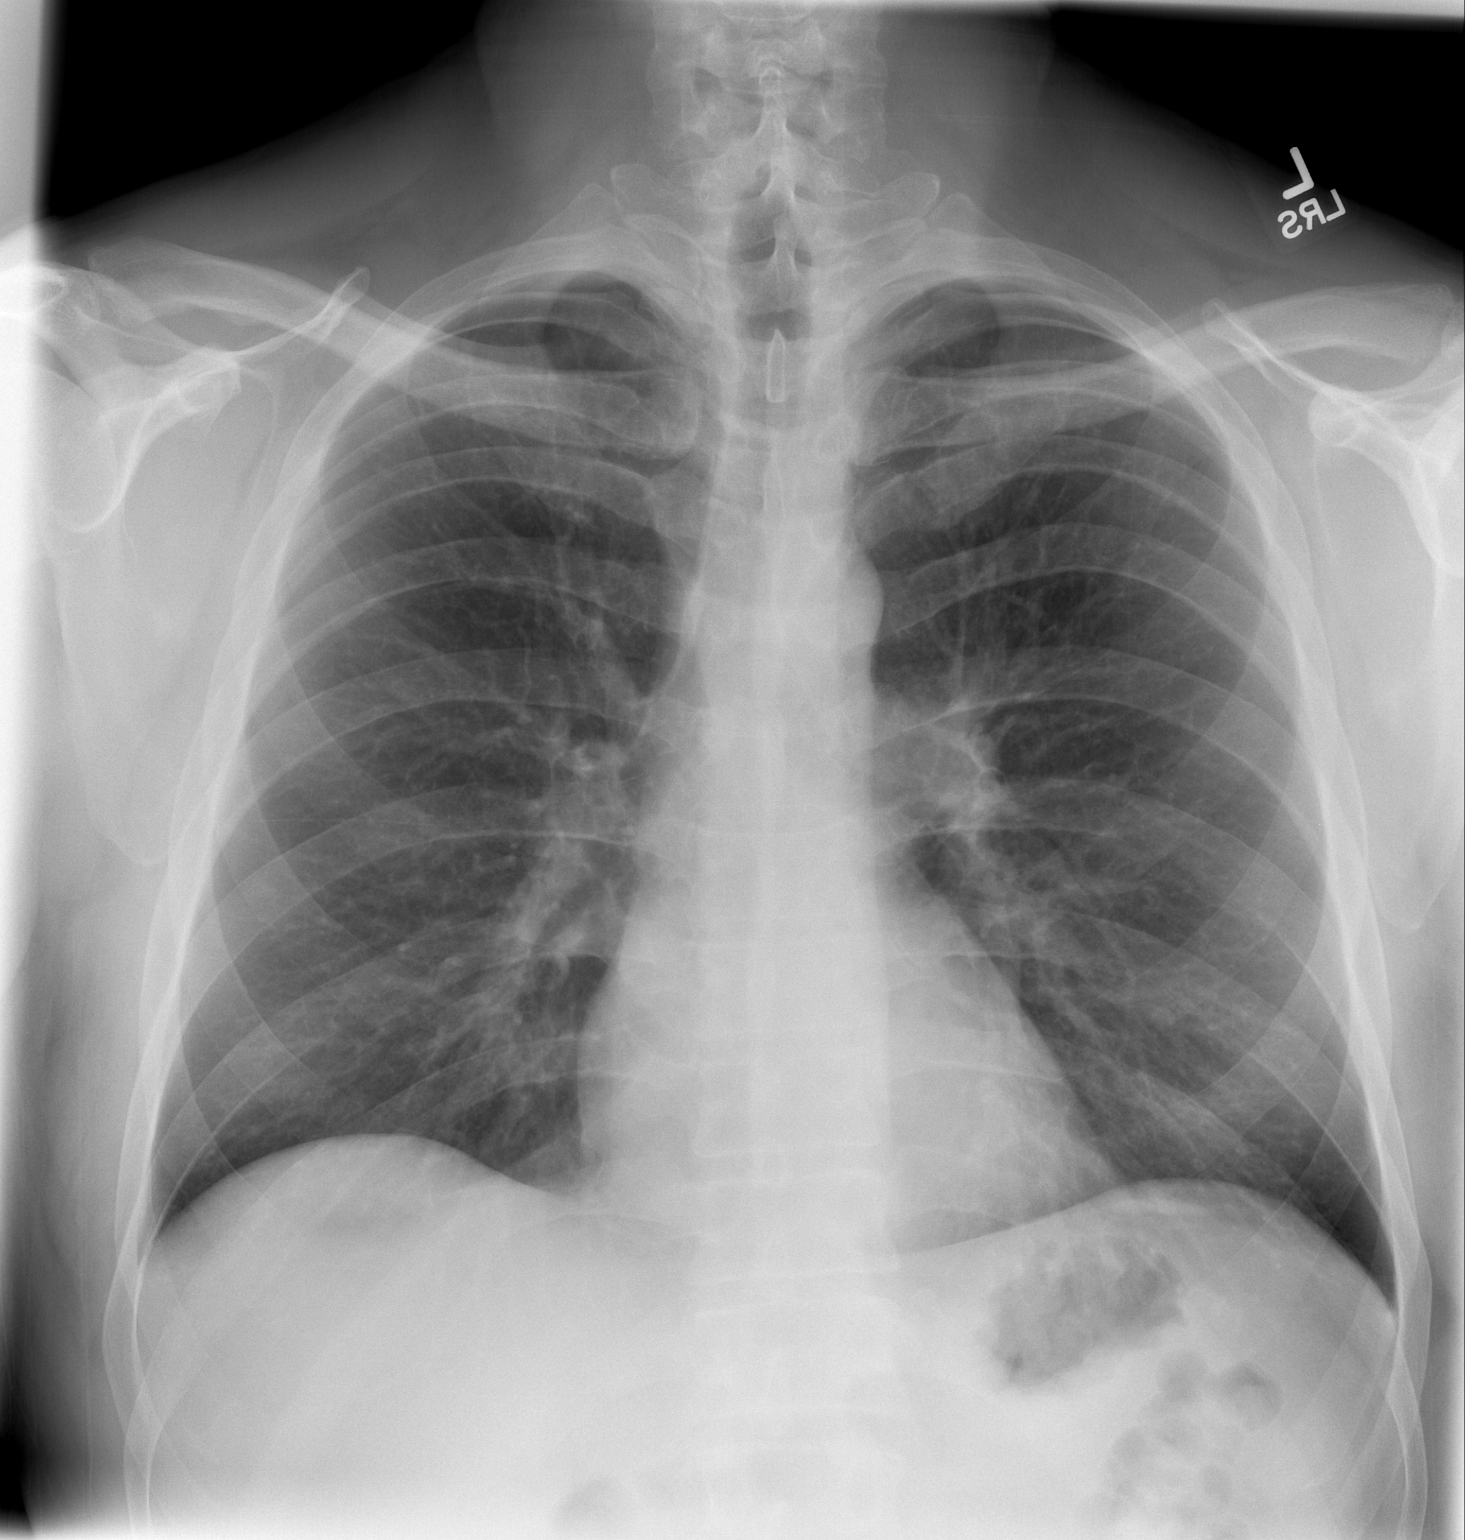

[w chest lat]
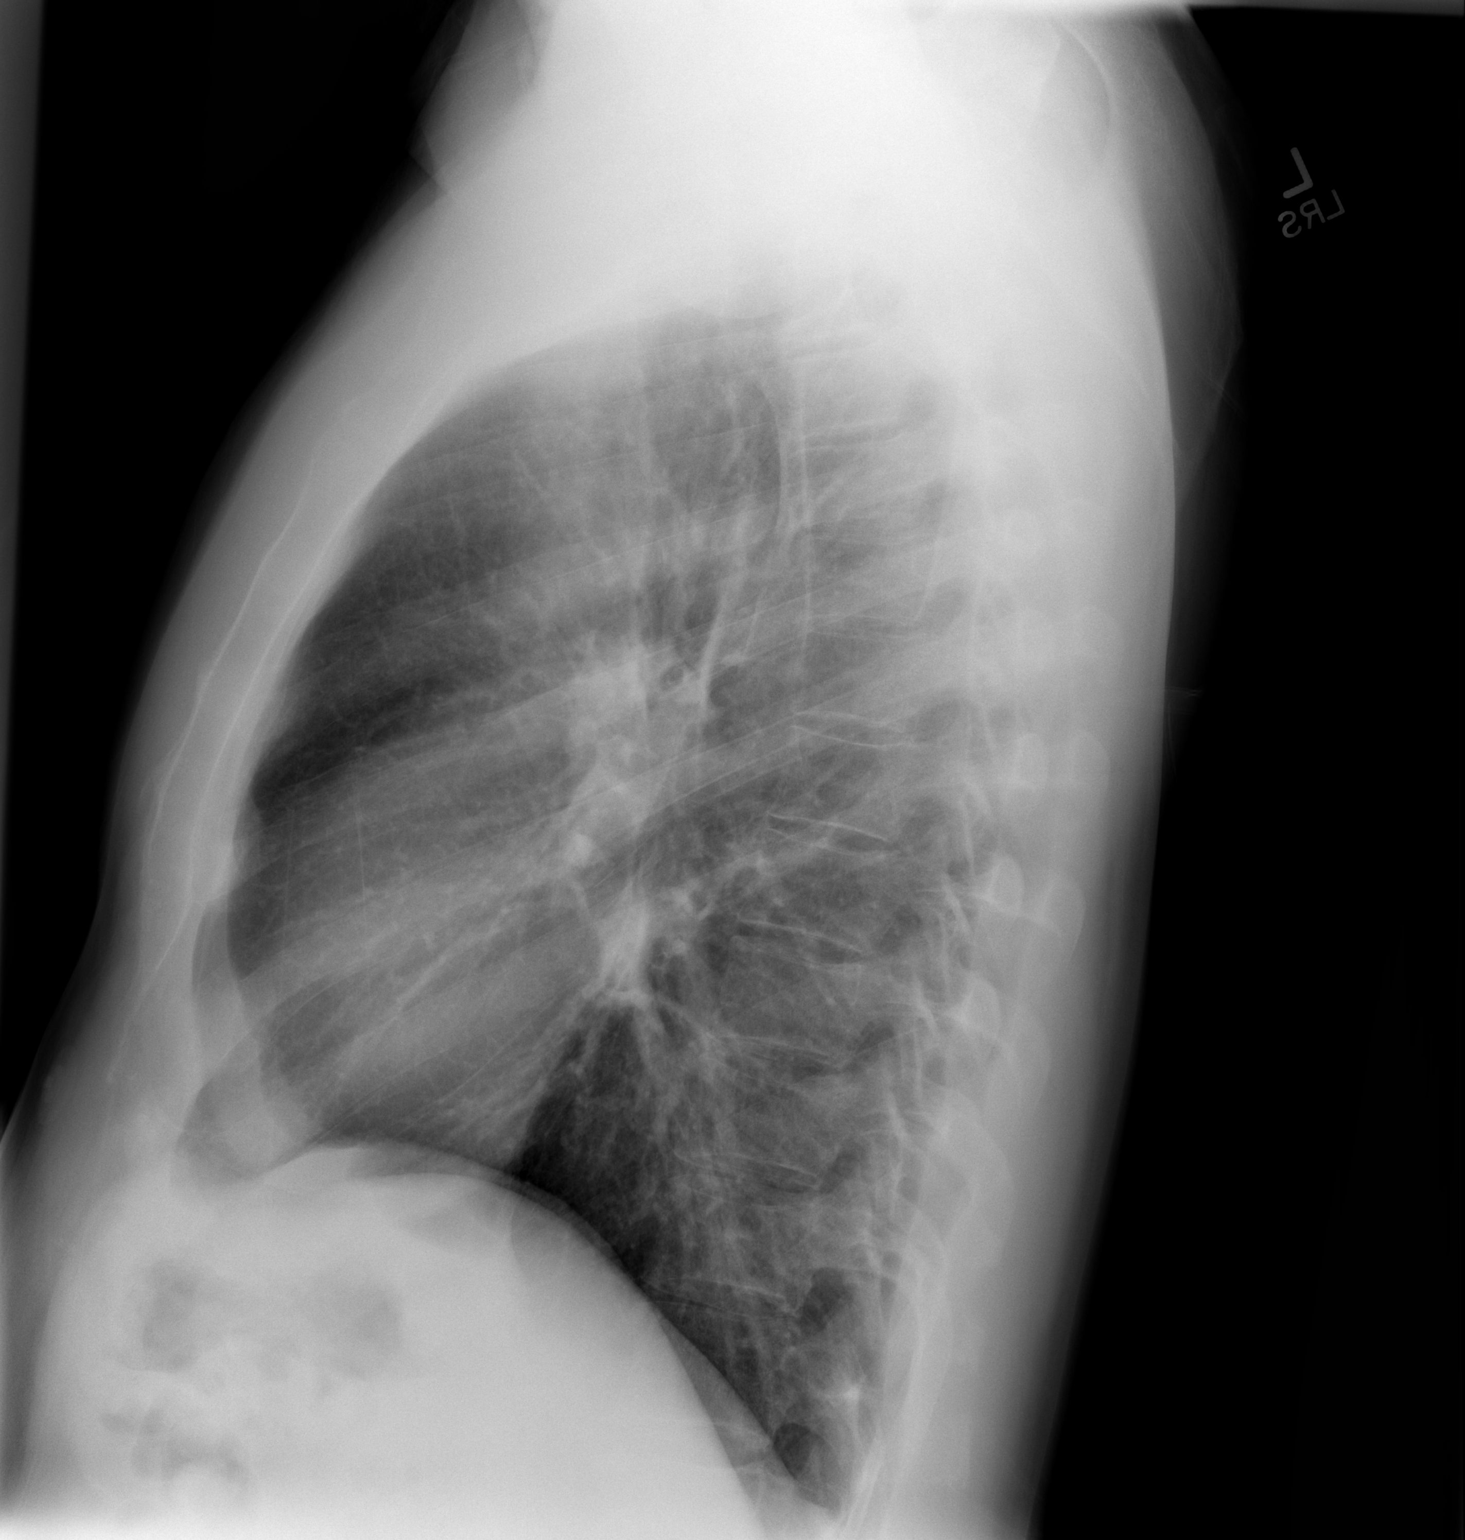

[2 of 2 positions shown; findings below may reference images not displayed]

FINDINGS: The lungs are well-aerated and clear. There is no evidence of focal
opacification, pleural effusion or pneumothorax.

The heart is normal in size; the mediastinal contour is within
normal limits. No acute osseous abnormalities are seen.
IMPRESSION: No acute cardiopulmonary process seen.

## 2015-11-27 ENCOUNTER — Ambulatory Visit (INDEPENDENT_AMBULATORY_CARE_PROVIDER_SITE_OTHER): Payer: 59 | Admitting: Family Medicine

## 2015-11-27 DIAGNOSIS — Z23 Encounter for immunization: Secondary | ICD-10-CM

## 2015-12-16 MED FILL — MELOXICAM 15 MG TABLET: 15 | 20 days supply | Qty: 20 | Fill #2

## 2016-01-15 ENCOUNTER — Telehealth: Payer: Self-pay | Admitting: Family Medicine

## 2016-01-15 NOTE — Telephone Encounter (Signed)
Appt scheduled with Shane Hudnall.  FYI

## 2016-01-15 NOTE — Telephone Encounter (Signed)
Patient Name: [name] Herman DOB: 11-07-1960 Initial Comment Caller states he has a small pain in his left thigh and he has questions. Nurse Assessment Nurse: Christel Mormon, RN, Levada Dy Date/Time Eilene Ghazi Time): 01/15/2016 4:15:40 PM Confirm and document reason for call. If symptomatic, describe symptoms. You must click the next button to save text entered. ---Caller states he has pain in the inner right thigh aprox 12" in length- "it follows that muscle". No redness or swelling. Has the patient traveled out of the country within the last 30 days? ---Yes Where have you traveled? ([place] for [place] and Ebola guideline, [place], [place] for [employer]) ---[place] Does the patient have any new or worsening symptoms? ---Yes Will a triage be completed? ---Yes Related visit to physician within the last 2 weeks? ---No Does the PT have any chronic conditions? (i.e. diabetes, asthma, etc.) ---No Is this a behavioral health or substance abuse call? ---No Guidelines Guideline Title Affirmed Question Affirmed Notes Leg Pain [1] Thigh or calf pain AND [2] only 1 side AND [3] present > 1 hour Final Disposition User See Physician within 4 Hours (or PCP triage) [place], RN, Levada Dy Comments Pt has class tonight- not sure if he will go in to be sen but if not, stated he will call the office tomorrow. Referrals GO TO FACILITY UNDECIDED Disagree/Comply: Comply

## 2016-01-15 NOTE — Telephone Encounter (Signed)
FYI

## 2016-01-16 ENCOUNTER — Encounter: Payer: Self-pay | Admitting: Family Medicine

## 2016-01-16 ENCOUNTER — Ambulatory Visit (INDEPENDENT_AMBULATORY_CARE_PROVIDER_SITE_OTHER): Payer: 59 | Admitting: Family Medicine

## 2016-01-16 DIAGNOSIS — M79604 Pain in right leg: Secondary | ICD-10-CM

## 2016-01-16 NOTE — Patient Instructions (Signed)
Your exam and ultrasound are reassuring. The vein here is very compressible and the artery has normal doppler flow too. The muscles look normal. Give me a call if anything changes. I suspect this is either an irritated cutaneous nerve or you may be developing shingles.

## 2016-01-21 DIAGNOSIS — M79604 Pain in right leg: Secondary | ICD-10-CM | POA: Insufficient documentation

## 2016-01-21 NOTE — Assessment & Plan Note (Signed)
unusual description.  Would suspect shingles but no rash yet and has had this for 4-7 days.  I performed u/s with doppler in area of pain and venous structures easily compressible with normal blood flow - no evidence of DVT.  Musculature also is normal without tears.  Reassured patient.  Call us with any changes.  Tylenol, ibuprofen if needed for soreness.

## 2016-01-21 NOTE — Progress Notes (Signed)
PCP: Eulas Post, MD  Subjective:   HPI: Patient is a 55 y.o. male here for right thigh pain.  Patient reports for about 4-7 days he's had an unusual burning/stinging sensation medial right thigh. Some tenderness to touch. No redness or swelling though. Pain is 3/10 at worst. Feels like a bruise. No skin changes, numbness. No back pain. Returned from a trip over a week prior to this starting.  Past Medical History:  Diagnosis Date  . Asymptomatic LV dysfunction    Echocardiogram 07/08/10: EF 40-45% with normal wall motion; question athlete's heart  . Chest pain    Cardiac catheterization 07/07/10: Normal coronary arteries; EF 65%;                             GI workup inconclusive with a normal abdominal ultrasound and normal HIDA scan  . Hx of adenomatous polyp of colon 08/30/2014  . Mobitz (type) I (Wenckebach's) atrioventricular block    Documented during sleep during admission for chest pain 07/06/10 to 07/10/10  . Myocardial infarction [facility]) 07-06-2010    Current Outpatient Prescriptions on File Prior to Visit  Medication Sig Dispense Refill  . atovaquone-proguanil (MALARONE) 250-100 MG TABS tablet One tablet daily starting 1 day prior to travel, during travel, and for one week after return. 18 tablet 0  . ciprofloxacin (CIPRO) 500 MG tablet Take 1 tablet (500 mg total) by mouth 2 (two) times daily. 12 tablet 0  . ibuprofen (ADVIL,MOTRIN) 200 MG tablet Take 800 mg by mouth daily as needed (pain).    . meloxicam (MOBIC) 15 MG tablet TAKE 1 TABLET BY MOUTH DAILY. 40 tablet 1  . pantoprazole (PROTONIX) 40 MG tablet TAKE 1 TABLET BY MOUTH DAILY. 90 tablet 2  . pimecrolimus (ELIDEL) 1 % cream Apply 1 application topically 2 (two) times daily as needed. 30 g 2  . typhoid (VIVOTIF) DR capsule Take 1 capsule by mouth every other day. 4 capsule 0   No current facility-administered medications on file prior to visit.     Past Surgical History:  Procedure Laterality Date  .  CARDIAC CATHETERIZATION    . None      No Known Allergies  Social History   Social History  . Marital status: Married    Spouse name: N/A  . Number of children: N/A  . Years of education: N/A   Occupational History  . Not on file.   Social History Main Topics  . Smoking status: Former Smoker    Packs/day: 0.50    Years: 3.00    Types: Cigarettes    Quit date: 07/30/1980  . Smokeless tobacco: Never Used  . Alcohol use 1.2 oz/week    2 Glasses of wine per week     Comment: occ < weekly  . Drug use: No  . Sexual activity: Not on file   Other Topics Concern  . Not on file   Social History Narrative   The patient is married.  He is a Software engineer.  He has a     97 year old son who just started driving and an 44 year old daughter.     He has not smoked since high school.           Family History  Problem Relation Age of Onset  . Hyperlipidemia Mother   . Hyperlipidemia Father   . Stroke Father     2010  . Heart disease Father 54    atrial fibrillation  .  Hyperlipidemia Maternal Grandmother   . Hyperlipidemia Maternal Grandfather   . Diabetes Maternal Grandfather   . Hyperlipidemia Paternal Grandmother   . Cancer Paternal Grandfather     colon  . Hyperlipidemia Paternal Grandfather   . Colon cancer Paternal Grandfather     BP 109/73   Pulse 71   Ht 6' (1.829 m)   Wt 192 lb (87.1 kg)   BMI 26.04 kg/m   Review of Systems: See HPI above.    Objective:  Physical Exam:  Gen: NAD, comfortable in exam room  Right leg: No gross deformity, swelling, bruising, redness. TTP mid-third right thigh.  No palpable cords. FROM knee and hip.  5/5 strength all motions and cannot reproduce his pain. Sensation intact to light touch. Normal distal pulses.  Left leg: FROM knee and hip without pain.  No rashes    MSK u/s right thigh:  No evidence arterial or venous abnormalities in area of pain - venous structures easily compressible here.  Adductors, vastus  lateralis appear normal.  Assessment & Plan:  1. Right leg pain - unusual description.  Would suspect shingles but no rash yet and has had this for 4-7 days.  I performed u/s with doppler in area of pain and venous structures easily compressible with normal blood flow - no evidence of DVT.  Musculature also is normal without tears.  Reassured patient.  Call us with any changes.  Tylenol, ibuprofen if needed for soreness.

## 2016-01-27 ENCOUNTER — Encounter: Payer: Self-pay | Admitting: Family Medicine

## 2016-01-27 MED ORDER — MELOXICAM 15 MG PO TABS: 15.0000 mg | ORAL_TABLET | Freq: Every day | ORAL | 1 refills | Status: DC

## 2016-01-27 MED FILL — PANTOPRAZOLE SOD DR 40 MG T: 40 | 90 days supply | Qty: 90 | Fill #1

## 2016-02-12 MED FILL — MELOXICAM 15 MG TABLET: 15 | 30 days supply | Qty: 30 | Fill #0

## 2016-04-20 MED FILL — PANTOPRAZOLE SOD DR 40 MG T: 40 | 90 days supply | Qty: 90 | Fill #2

## 2016-04-20 MED FILL — MELOXICAM 15 MG TABLET: 15 | 30 days supply | Qty: 30 | Fill #1

## 2016-07-24 DIAGNOSIS — M25521 Pain in right elbow: Secondary | ICD-10-CM | POA: Diagnosis not present

## 2016-07-27 ENCOUNTER — Other Ambulatory Visit: Payer: Self-pay | Admitting: Family Medicine

## 2016-07-27 MED FILL — PANTOPRAZOLE SOD DR 40 MG T: 40 | 90 days supply | Qty: 90 | Fill #0

## 2016-08-17 ENCOUNTER — Encounter: Payer: Self-pay | Admitting: Family Medicine

## 2016-08-17 ENCOUNTER — Ambulatory Visit (INDEPENDENT_AMBULATORY_CARE_PROVIDER_SITE_OTHER): Payer: 59 | Admitting: Family Medicine

## 2016-08-17 VITALS — BP 110/78 | HR 87 | Temp 98.0°F | Wt 197.4 lb

## 2016-08-17 DIAGNOSIS — J029 Acute pharyngitis, unspecified: Secondary | ICD-10-CM | POA: Diagnosis not present

## 2016-08-17 LAB — POCT RAPID STREP A (OFFICE): RAPID STREP A SCREEN: NEGATIVE

## 2016-08-17 MED ORDER — LIDOCAINE VISCOUS 2 % MT SOLN: 20.0000 mL | OROMUCOSAL | 1 refills | Status: DC | PRN

## 2016-08-17 MED FILL — LIDOCAINE 2% VISCOUS SOLN: 2 | 1 days supply | Qty: 100 | Fill #0

## 2016-08-17 NOTE — Progress Notes (Signed)
Pre visit review using our clinic review tool, if applicable. No additional management support is needed unless otherwise documented below in the visit note. 

## 2016-08-17 NOTE — Patient Instructions (Signed)

## 2016-08-17 NOTE — Progress Notes (Signed)
Subjective:     Patient ID: [name] Herman, male   DOB: [birth year], 56 y.o.   MRN: [id number]  HPI Acute visit for pharyngitis. Onset about 3 days ago. He's had some slight postnasal drip symptoms and occasional cough but no fever. No sick contacts. Denies any nausea, vomiting, or diarrhea. Taken Advil and NyQuil with minimal relief. Severe sore throat symptoms at night. Eating and drinking fluids has been very difficult secondary to pain.  Past Medical History:  Diagnosis Date  . Asymptomatic LV dysfunction    Echocardiogram 07/08/10: EF 40-45% with normal wall motion; question athlete's heart  . Chest pain    Cardiac catheterization 07/07/10: Normal coronary arteries; EF 65%;                             GI workup inconclusive with a normal abdominal ultrasound and normal HIDA scan  . Hx of adenomatous polyp of colon 08/30/2014  . Mobitz (type) I (Wenckebach's) atrioventricular block    Documented during sleep during admission for chest pain 07/06/10 to 07/10/10  . Myocardial infarction 07-06-2010   Past Surgical History:  Procedure Laterality Date  . CARDIAC CATHETERIZATION    . None      reports that he quit smoking about 36 years ago. His smoking use included Cigarettes. He has a 1.50 pack-year smoking history. He has never used smokeless tobacco. He reports that he drinks about 1.2 oz of alcohol per week . He reports that he does not use drugs. family history includes Cancer in his paternal grandfather; Colon cancer in his paternal grandfather; Diabetes in his maternal grandfather; Heart disease (age of onset: 74) in his father; Hyperlipidemia in his father, maternal grandfather, maternal grandmother, mother, paternal grandfather, and paternal grandmother; Stroke in his father. No Known Allergies   Review of Systems  Constitutional: Negative for chills and fever.  HENT: Positive for postnasal drip.   Respiratory: Positive for cough.   Neurological: Negative for headaches.        Objective:   Physical Exam  Constitutional: He appears well-developed and well-nourished.  HENT:  Right Ear: External ear normal.  Left Ear: External ear normal.  Minimal posterior pharynx erythema. No exudate  Neck: Neck supple.  Cardiovascular: Normal rate and regular rhythm.   Pulmonary/Chest: Effort normal and breath sounds normal. No respiratory distress. He has no wheezes. He has no rales.  Lymphadenopathy:    He has no cervical adenopathy.  Skin: No rash noted.       Assessment:     Pharyngitis. Rapid strep negative. Suspect viral    Plan:     -Symptomatic treatment. Viscous Xylocaine dilute and use every 3-4 hours as needed -May continue Advil or Aleve for symptom relief and follow-up as needed  Eulas Post MD [facility] at [facility]

## 2016-08-18 ENCOUNTER — Telehealth: Payer: Self-pay | Admitting: Family Medicine

## 2016-08-18 MED ORDER — OSELTAMIVIR PHOSPHATE 75 MG PO CAPS: 75.0000 mg | ORAL_CAPSULE | Freq: Two times a day (BID) | ORAL | 0 refills | Status: DC

## 2016-08-18 MED FILL — OSELTAMIVIR PHOSPHATE 75 MG: 75 | 5 days supply | Qty: 10 | Fill #0

## 2016-08-18 NOTE — Telephone Encounter (Signed)
Rx sent and wife is aware 

## 2016-08-18 NOTE — Telephone Encounter (Signed)
tamiflu 75 mg po bid for 5 days.

## 2016-08-18 NOTE — Telephone Encounter (Signed)
Pts wife calling stating that the pt has a fever now and is feeling worse than yesterday and on the weekend had family members that was staying over and when they went back to home they tested positive for the flu and would like to see if Dr. Elease Hashimoto would call him in some Tamiflu. Pharm:  [facility] outpt pharmacy

## 2016-08-21 ENCOUNTER — Encounter: Payer: Self-pay | Admitting: Family Medicine

## 2016-10-18 MED FILL — PANTOPRAZOLE SOD DR 40 MG T: 40 | 90 days supply | Qty: 90 | Fill #1

## 2016-11-25 ENCOUNTER — Encounter: Payer: Self-pay | Admitting: Family Medicine

## 2016-11-26 ENCOUNTER — Other Ambulatory Visit: Payer: Self-pay | Admitting: *Deleted

## 2016-11-26 MED ORDER — PIMECROLIMUS 1 % EX CREA: TOPICAL_CREAM | Freq: Two times a day (BID) | CUTANEOUS | 1 refills | Status: DC | PRN

## 2016-11-26 NOTE — Telephone Encounter (Signed)
Rx done and the pt was notified via Mychart message. 

## 2016-12-02 ENCOUNTER — Telehealth: Payer: Self-pay

## 2016-12-02 NOTE — Telephone Encounter (Signed)
Received PA request for Elidel. PA submitted & is pending. Key: [name] Herman

## 2016-12-08 ENCOUNTER — Telehealth: Payer: Self-pay | Admitting: *Deleted

## 2016-12-08 ENCOUNTER — Encounter: Payer: Self-pay | Admitting: *Deleted

## 2016-12-08 MED ORDER — TRIAMCINOLONE ACETONIDE 0.1 % EX CREA: TOPICAL_CREAM | CUTANEOUS | 1 refills | Status: DC

## 2016-12-08 MED FILL — TRIAMCINOLONE 0.1% CREAM: 0.1 | 10 days supply | Qty: 15 | Fill #0

## 2016-12-08 NOTE — Telephone Encounter (Signed)
Insurance requires a trial of topical corticosteroids before filling pimecrolimus (ELIDEL) 1 % cream.  New Rx ?

## 2016-12-08 NOTE — Telephone Encounter (Signed)
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  Recommend trial of triamcinolone 0.1% cream to use twice daily as needed to affected rash 15 g with 1 refill

## 2017-01-04 ENCOUNTER — Other Ambulatory Visit: Payer: Self-pay | Admitting: Family Medicine

## 2017-01-04 DIAGNOSIS — H524 Presbyopia: Secondary | ICD-10-CM | POA: Diagnosis not present

## 2017-01-04 DIAGNOSIS — H353111 Nonexudative age-related macular degeneration, right eye, early dry stage: Secondary | ICD-10-CM | POA: Diagnosis not present

## 2017-01-04 DIAGNOSIS — H353121 Nonexudative age-related macular degeneration, left eye, early dry stage: Secondary | ICD-10-CM | POA: Diagnosis not present

## 2017-01-04 MED FILL — PANTOPRAZOLE SOD DR 40 MG T: 40 | 90 days supply | Qty: 90 | Fill #0

## 2017-02-04 ENCOUNTER — Encounter: Payer: Self-pay | Admitting: Family Medicine

## 2017-04-16 ENCOUNTER — Encounter: Payer: Self-pay | Admitting: Family Medicine

## 2017-05-07 MED FILL — PANTOPRAZOLE SOD DR 40 MG T: 40 | 90 days supply | Qty: 90 | Fill #1

## 2017-07-22 MED FILL — CHLORHEXIDINE 0.12% RINSE: 0.12 | 16 days supply | Qty: 473 | Fill #0

## 2017-07-28 ENCOUNTER — Ambulatory Visit (INDEPENDENT_AMBULATORY_CARE_PROVIDER_SITE_OTHER): Payer: 59 | Admitting: Family Medicine

## 2017-07-28 ENCOUNTER — Encounter: Payer: Self-pay | Admitting: Family Medicine

## 2017-07-28 VITALS — BP 110/70 | HR 76 | Temp 98.1°F | Ht 73.0 in | Wt 189.9 lb

## 2017-07-28 DIAGNOSIS — D126 Benign neoplasm of colon, unspecified: Secondary | ICD-10-CM

## 2017-07-28 DIAGNOSIS — Z0000 Encounter for general adult medical examination without abnormal findings: Secondary | ICD-10-CM | POA: Diagnosis not present

## 2017-07-28 LAB — CBC WITH DIFFERENTIAL/PLATELET
Basophils Absolute: 0 10*3/uL (ref 0.0–0.1)
Basophils Relative: 0.7 % (ref 0.0–3.0)
EOS ABS: 0.4 10*3/uL (ref 0.0–0.7)
Eosinophils Relative: 6.1 % — ABNORMAL HIGH (ref 0.0–5.0)
HCT: 44.6 % (ref 39.0–52.0)
HEMOGLOBIN: 15.3 g/dL (ref 13.0–17.0)
Lymphocytes Relative: 29.1 % (ref 12.0–46.0)
Lymphs Abs: 1.7 10*3/uL (ref 0.7–4.0)
MCHC: 34.3 g/dL (ref 30.0–36.0)
MCV: 95.5 fl (ref 78.0–100.0)
MONO ABS: 0.6 10*3/uL (ref 0.1–1.0)
Monocytes Relative: 9.6 % (ref 3.0–12.0)
Neutro Abs: 3.2 10*3/uL (ref 1.4–7.7)
Neutrophils Relative %: 54.5 % (ref 43.0–77.0)
Platelets: 312 10*3/uL (ref 150.0–400.0)
RBC: 4.67 Mil/uL (ref 4.22–5.81)
RDW: 12.4 % (ref 11.5–15.5)
WBC: 5.8 10*3/uL (ref 4.0–10.5)

## 2017-07-28 LAB — LIPID PANEL
Cholesterol: 196 mg/dL (ref 0–200)
HDL: 51.5 mg/dL (ref >39.00)
LDL CALC: 129 mg/dL — AB (ref 0–99)
NONHDL: 144.85
Total CHOL/HDL Ratio: 4
Triglycerides: 78 mg/dL (ref 0.0–149.0)
VLDL: 15.6 mg/dL (ref 0.0–40.0)

## 2017-07-28 LAB — HEPATIC FUNCTION PANEL
ALK PHOS: 45 U/L (ref 39–117)
ALT: 20 U/L (ref 0–53)
AST: 15 U/L (ref 0–37)
Albumin: 4.7 g/dL (ref 3.5–5.2)
BILIRUBIN DIRECT: 0.1 mg/dL (ref 0.0–0.3)
BILIRUBIN TOTAL: 0.6 mg/dL (ref 0.2–1.2)
Total Protein: 7.3 g/dL (ref 6.0–8.3)

## 2017-07-28 LAB — BASIC METABOLIC PANEL
BUN: 20 mg/dL (ref 6–23)
CHLORIDE: 102 meq/L (ref 96–112)
CO2: 29 mEq/L (ref 19–32)
CREATININE: 1.13 mg/dL (ref 0.40–1.50)
Calcium: 9.8 mg/dL (ref 8.4–10.5)
GFR: 71.75 mL/min (ref >60.00)
GLUCOSE: 97 mg/dL (ref 70–99)
POTASSIUM: 4.3 meq/L (ref 3.5–5.1)
Sodium: 138 mEq/L (ref 135–145)

## 2017-07-28 LAB — PSA: PSA: 0.44 ng/mL (ref 0.10–4.00)

## 2017-07-28 LAB — TSH: TSH: 1.46 u[IU]/mL (ref 0.35–4.50)

## 2017-07-28 NOTE — Patient Instructions (Signed)
Consider repeat Hep A prior to [place] trip We are contacting GI for your repeat colonoscopy.

## 2017-07-28 NOTE — Progress Notes (Addendum)
Subjective:     Patient ID: [name], male   DOB: 1960-09-16, 57 y.o.   MRN: [id number]  HPI Patient seen for physical exam. Generally very healthy. He exercises regularly with weight lifting 3 times a week and is doing more aerobic activity. He had adenomatous colon polyp back in April 2016 with recommended three-year follow-up colonoscopy. This has not yet been scheduled. No history of hepatitis C screening. He plans to travel back to [name] later this year. He's had one hepatitis A but apparently never got the booster. Takes no regular medications currently. Nonsmoker.  The 10-year ASCVD risk score [name]., et al., 2013) is: 3.7%   Values used to calculate the score:     Age: 89 years     Sex: Male     Is Non-Hispanic African American: No     Diabetic: No     Tobacco smoker: No     Systolic Blood Pressure: 703 mmHg     Is BP treated: No     HDL Cholesterol: 51.5 mg/dL     Total Cholesterol: 196 mg/dL   Past Medical History:  Diagnosis Date  . Asymptomatic LV dysfunction    Echocardiogram 07/08/10: EF 40-45% with normal wall motion; question athlete's heart  . Chest pain    Cardiac catheterization 07/07/10: Normal coronary arteries; EF 65%;                             GI workup inconclusive with a normal abdominal ultrasound and normal HIDA scan  . Hx of adenomatous polyp of colon 08/30/2014  . Mobitz (type) I (Wenckebach's) atrioventricular block    Documented during sleep during admission for chest pain 07/06/10 to 07/10/10  . Myocardial infarction [facility]) 07-06-2010   Past Surgical History:  Procedure Laterality Date  . CARDIAC CATHETERIZATION    . None      reports that he quit smoking about 37 years ago. His smoking use included cigarettes. He has a 1.50 pack-year smoking history. he has never used smokeless tobacco. He reports that he drinks about 1.2 oz of alcohol per week. He reports that he does not use drugs. family history includes Cancer in his paternal  grandfather; Colon cancer in his paternal grandfather; Diabetes in his maternal grandfather; Heart disease (age of onset: 90) in his father; Hyperlipidemia in his father, maternal grandfather, maternal grandmother, mother, paternal grandfather, and paternal grandmother; Stroke in his father. No Known Allergies   Review of Systems  Constitutional: Negative for activity change, appetite change, fatigue and fever.  HENT: Negative for congestion, ear pain and trouble swallowing.   Eyes: Negative for pain and visual disturbance.  Respiratory: Negative for cough, shortness of breath and wheezing.   Cardiovascular: Negative for chest pain and palpitations.  Gastrointestinal: Negative for abdominal distention, abdominal pain, blood in stool, constipation, diarrhea, nausea, rectal pain and vomiting.  Genitourinary: Negative for dysuria, hematuria and testicular pain.  Musculoskeletal: Negative for arthralgias and joint swelling.  Skin: Negative for rash.  Neurological: Negative for dizziness, syncope and headaches.  Hematological: Negative for adenopathy.  Psychiatric/Behavioral: Negative for confusion and dysphoric mood.       Objective:   Physical Exam  Constitutional: He is oriented to person, place, and time. He appears well-developed and well-nourished. No distress.  HENT:  Head: Normocephalic and atraumatic.  Right Ear: External ear normal.  Left Ear: External ear normal.  Mouth/Throat: Oropharynx is clear and moist.  Eyes:  Conjunctivae and EOM are normal. Pupils are equal, round, and reactive to light.  Neck: Normal range of motion. Neck supple. No thyromegaly present.  Cardiovascular: Normal rate, regular rhythm and normal heart sounds.  No murmur [name]. Pulmonary/Chest: No respiratory distress. He has no wheezes. He has no rales.  Abdominal: Soft. Bowel sounds are normal. He exhibits no distension and no mass. There is no tenderness. There is no rebound and no guarding.   Musculoskeletal: He exhibits no edema.  Lymphadenopathy:    He has no cervical adenopathy.  Neurological: He is alert and oriented to person, place, and time. He displays normal reflexes. No cranial nerve deficit.  Skin: No rash noted.  Psychiatric: He has a normal mood and affect.       Assessment:     Physical exam. Following issues were addressed as below    Plan:     -Set up repeat colonoscopy -Obtain screening lab work including hepatitis C antibody -Continue regular exercise habits -Recommend he consider new shingles vaccine -Also recommend he get hepatitis A booster prior to his upcoming trip to [place] MD [name]

## 2017-07-29 LAB — HEPATITIS C ANTIBODY
Hepatitis C Ab: NONREACTIVE
SIGNAL TO CUT-OFF: 0.22 (ref <1.00)

## 2017-07-30 ENCOUNTER — Other Ambulatory Visit: Payer: Self-pay | Admitting: Family Medicine

## 2017-07-30 MED FILL — PANTOPRAZOLE SOD DR 40 MG T: 40 | 90 days supply | Qty: 90 | Fill #0

## 2017-08-03 MED FILL — CHLORHEXIDINE 0.12% RINSE: 0.12 | 16 days supply | Qty: 473 | Fill #1

## 2017-08-10 MED FILL — TRIAMCINOLONE 0.1% CREAM: 0.1 | 10 days supply | Qty: 15 | Fill #1

## 2017-08-16 ENCOUNTER — Encounter: Payer: Self-pay | Admitting: Internal Medicine

## 2017-08-19 MED FILL — CHLORHEXIDINE 0.12% RINSE: 0.12 | 16 days supply | Qty: 473 | Fill #2

## 2017-08-23 ENCOUNTER — Encounter: Payer: Self-pay | Admitting: Internal Medicine

## 2017-09-06 MED FILL — CHLORHEXIDINE 0.12% RINSE: 0.12 | 16 days supply | Qty: 473 | Fill #3

## 2017-10-15 ENCOUNTER — Other Ambulatory Visit: Payer: Self-pay

## 2017-10-15 ENCOUNTER — Encounter: Payer: Self-pay | Admitting: Family Medicine

## 2017-10-15 ENCOUNTER — Ambulatory Visit (AMBULATORY_SURGERY_CENTER): Payer: Self-pay | Admitting: *Deleted

## 2017-10-15 VITALS — Ht 73.0 in | Wt 198.0 lb

## 2017-10-15 DIAGNOSIS — Z86010 Personal history of colonic polyps: Secondary | ICD-10-CM

## 2017-10-15 NOTE — Progress Notes (Signed)
Patient denies any allergies to eggs or soy. Patient denies any problems with anesthesia/sedation. Patient denies any oxygen use at home. Patient denies taking any diet/weight loss medications or blood thinners. EMMI education offered, pt declined.  

## 2017-10-20 ENCOUNTER — Encounter: Payer: Self-pay | Admitting: Internal Medicine

## 2017-10-20 ENCOUNTER — Other Ambulatory Visit: Payer: Self-pay

## 2017-10-20 ENCOUNTER — Ambulatory Visit (AMBULATORY_SURGERY_CENTER): Payer: 59 | Admitting: Internal Medicine

## 2017-10-20 VITALS — BP 96/59 | HR 64 | Temp 97.5°F | Resp 12 | Ht 73.0 in | Wt 198.0 lb

## 2017-10-20 DIAGNOSIS — I2510 Atherosclerotic heart disease of native coronary artery without angina pectoris: Secondary | ICD-10-CM | POA: Diagnosis not present

## 2017-10-20 DIAGNOSIS — Z86010 Personal history of colonic polyps: Secondary | ICD-10-CM

## 2017-10-20 DIAGNOSIS — K219 Gastro-esophageal reflux disease without esophagitis: Secondary | ICD-10-CM | POA: Diagnosis not present

## 2017-10-20 DIAGNOSIS — I252 Old myocardial infarction: Secondary | ICD-10-CM | POA: Diagnosis not present

## 2017-10-20 MED ORDER — SODIUM CHLORIDE 0.9 % IV SOLN: 500.0000 mL | Freq: Once | INTRAVENOUS | Status: DC

## 2017-10-20 NOTE — Patient Instructions (Addendum)
   No polyps today!  Your next routine colonoscopy should be in 5 years - 2024.  I appreciate the opportunity to care for you. Carl E. Gessner, MD, FACG  YOU HAD AN ENDOSCOPIC PROCEDURE TODAY AT THE [facility]:   Refer to the procedure report that was given to you for any specific questions about what was found during the examination.  If the procedure report does not answer your questions, please call your gastroenterologist to clarify.  If you requested that your care partner not be given the details of your procedure findings, then the procedure report has been included in a sealed envelope for you to review at your convenience later.  YOU SHOULD EXPECT: Some feelings of bloating in the abdomen. Passage of more gas than usual.  Walking can help get rid of the air that was put into your GI tract during the procedure and reduce the bloating. If you had a lower endoscopy (such as a colonoscopy or flexible sigmoidoscopy) you may notice spotting of blood in your stool or on the toilet paper. If you underwent a bowel prep for your procedure, you may not have a normal bowel movement for a few days.  Please Note:  You might notice some irritation and congestion in your nose or some drainage.  This is from the oxygen used during your procedure.  There is no need for concern and it should clear up in a day or so.  SYMPTOMS TO REPORT IMMEDIATELY:   Following lower endoscopy (colonoscopy or flexible sigmoidoscopy):  Excessive amounts of blood in the stool  Significant tenderness or worsening of abdominal pains  Swelling of the abdomen that is new, acute  Fever of 100F or higher  For urgent or emergent issues, a gastroenterologist can be reached at any hour by calling [phone].   DIET:  We do recommend a small meal at first, but then you may proceed to your regular diet.  Drink plenty of fluids but you should avoid alcoholic beverages for 24 hours.  ACTIVITY:  You should  plan to take it easy for the rest of today and you should NOT DRIVE or use heavy machinery until tomorrow (because of the sedation medicines used during the test).    FOLLOW UP: Our staff will call the number listed on your records the next business day following your procedure to check on you and address any questions or concerns that you may have regarding the information given to you following your procedure. If we do not reach you, we will leave a message.  However, if you are feeling well and you are not experiencing any problems, there is no need to return our call.  We will assume that you have returned to your regular daily activities without incident.  If any biopsies were taken you will be contacted by phone or by letter within the next 1-3 weeks.  Please call us at [phone] if you have not heard about the biopsies in 3 weeks.    SIGNATURES/CONFIDENTIALITY: You and/or your care partner have signed paperwork which will be entered into your electronic medical record.  These signatures attest to the fact that that the information above on your After Visit Summary has been reviewed and is understood.  Full responsibility of the confidentiality of this discharge information lies with you and/or your care-partner. 

## 2017-10-20 NOTE — Op Note (Signed)
[facility] Patient Name: [name] Procedure Date: 10/20/2017 9:27 AM MRN: [id number] Endoscopist: Gatha Mayer , MD Age: 57 Referring MD:  Date of Birth: December 08, 1960 Gender: Male Account #: [id number] Procedure:                Colonoscopy Indications:              Surveillance: Personal history of adenomatous                            polyps on last colonoscopy 3 years ago Medicines:                Propofol per Anesthesia, Monitored Anesthesia Care Procedure:                Pre-Anesthesia Assessment:                           - Prior to the procedure, a History and Physical                            was performed, and patient medications and                            allergies were reviewed. The patient's tolerance of                            previous anesthesia was also reviewed. The risks                            and benefits of the procedure and the sedation                            options and risks were discussed with the patient.                            All questions were answered, and informed consent                            was obtained. Prior Anticoagulants: The patient has                            taken no previous anticoagulant or antiplatelet                            agents. ASA Grade Assessment: II - A patient with                            mild systemic disease. After reviewing the risks                            and benefits, the patient was deemed in                            satisfactory condition to undergo the procedure.  After obtaining informed consent, the colonoscope                            was passed under direct vision. Throughout the                            procedure, the patient's blood pressure, pulse, and                            oxygen saturations were monitored continuously. The                            Colonoscope was introduced through the anus and   advanced to the the cecum, identified by                            appendiceal orifice and ileocecal valve. The                            colonoscopy was performed without difficulty. The                            patient tolerated the procedure well. The quality                            of the bowel preparation was excellent. The                            ileocecal valve, appendiceal orifice, and rectum                            were photographed. The bowel preparation used was                            Miralax. Scope In: 9:51:23 AM Scope Out: 10:08:17 AM Scope Withdrawal Time: 0 hours 13 minutes 54 seconds  Total Procedure Duration: 0 hours 16 minutes 54 seconds  Findings:                 The entire examined colon appeared normal on direct                            and retroflexion views. Complications:            No immediate complications. Estimated Blood Loss:     Estimated blood loss: none. Impression:               - The entire examined colon is normal on direct and                            retroflexion views.                           - No specimens collected.                           - Personal  history of colonic polyp 12 mm adenoma                            2014. Recommendation:           - Patient has a contact number available for                            emergencies. The signs and symptoms of potential                            delayed complications were discussed with the                            patient. Return to normal activities tomorrow.                            Written discharge instructions were provided to the                            patient.                           - Resume previous diet.                           - Continue present medications.                           - Repeat colonoscopy in 5 years for surveillance. Gatha Mayer, MD 10/20/2017 10:16:22 AM This report has been signed electronically.

## 2017-10-20 NOTE — Progress Notes (Signed)
Report given to PACU, vss 

## 2017-10-21 ENCOUNTER — Telehealth: Payer: Self-pay

## 2017-10-21 ENCOUNTER — Telehealth: Payer: Self-pay | Admitting: *Deleted

## 2017-10-21 NOTE — Telephone Encounter (Signed)
Number identifier, left a message. 

## 2017-10-21 NOTE — Telephone Encounter (Signed)
  Follow up Call-  Call back number 10/20/2017  Post procedure Call Back phone  # [phone]  Permission to leave phone message Yes  Some recent data might be hidden     Patient questions:  Do you have a fever, pain , or abdominal swelling? no Pain Score  0 *  Have you tolerated food without any problems? Yes.    Have you been able to return to your normal activities? Yes.    Do you have any questions about your discharge instructions: Diet   No. Medications  No. Follow up visit  No.  Do you have questions or concerns about your Care? No.  Actions: * If pain score is 4 or above: No action needed, pain <4.

## 2017-10-29 ENCOUNTER — Encounter: Payer: 59 | Admitting: Internal Medicine

## 2017-11-02 MED FILL — PANTOPRAZOLE SOD DR 40 MG T: 40 | 90 days supply | Qty: 90 | Fill #1

## 2017-11-16 MED FILL — CHLORHEXIDINE 0.12% RINSE: 0.12 | 16 days supply | Qty: 473 | Fill #0

## 2017-11-29 MED FILL — CHLORHEXIDINE 0.12% RINSE: 0.12 | 16 days supply | Qty: 473 | Fill #1

## 2017-12-11 MED FILL — CHLORHEXIDINE 0.12% RINSE: 0.12 | 16 days supply | Qty: 473 | Fill #2

## 2017-12-23 MED FILL — CHLORHEXIDINE 0.12% RINSE: 0.12 | 16 days supply | Qty: 473 | Fill #3

## 2017-12-28 ENCOUNTER — Ambulatory Visit (INDEPENDENT_AMBULATORY_CARE_PROVIDER_SITE_OTHER): Payer: 59

## 2017-12-28 VITALS — HR 71 | Ht 73.0 in

## 2017-12-28 DIAGNOSIS — I499 Cardiac arrhythmia, unspecified: Secondary | ICD-10-CM | POA: Diagnosis not present

## 2017-12-28 NOTE — Progress Notes (Signed)
1.) Reason for visit: paroxysmal tachycardia   2.) Name of MD requesting visit: Dr. Rayann Heman  3.) H&P: [place] and developed tachycardia.    4.) ROS related to problem: Pt states he has not been getting a lot of sleep lately, trying to readjust to time change.  Pt in NAD.   5.) Assessment and plan per MD: EKG assessed by Dr. Rayann Heman.  Pt advised to avoid alcohol, hydrate and call office if any further issues.

## 2017-12-28 NOTE — Patient Instructions (Signed)
Medication Instructions:  Your physician recommends that you continue on your current medications as directed. Please refer to the Current Medication list given to you today.  Labwork: None ordered.  Testing/Procedures: None ordered.  Follow-Up: Your physician wants you to follow-up in: as needed with Dr. Allred.       Any Other Special Instructions Will Be Listed Below (If Applicable).  If you need a refill on your cardiac medications before your next appointment, please call your pharmacy.   

## 2018-01-04 MED FILL — CHLORHEXIDINE 0.12% RINSE: 0.12 | 16 days supply | Qty: 473 | Fill #4

## 2018-01-11 ENCOUNTER — Encounter: Payer: Self-pay | Admitting: Family Medicine

## 2018-01-11 MED FILL — SM BLOOD PRESSURE MONITOR: 1 days supply | Qty: 1 | Fill #0

## 2018-01-18 DIAGNOSIS — H524 Presbyopia: Secondary | ICD-10-CM | POA: Diagnosis not present

## 2018-01-18 DIAGNOSIS — H353111 Nonexudative age-related macular degeneration, right eye, early dry stage: Secondary | ICD-10-CM | POA: Diagnosis not present

## 2018-01-18 MED FILL — CHLORHEXIDINE 0.12% RINSE: 0.12 | 16 days supply | Qty: 473 | Fill #5

## 2018-01-19 ENCOUNTER — Encounter: Payer: Self-pay | Admitting: Family Medicine

## 2018-01-19 ENCOUNTER — Ambulatory Visit (INDEPENDENT_AMBULATORY_CARE_PROVIDER_SITE_OTHER): Payer: 59 | Admitting: Family Medicine

## 2018-01-19 VITALS — BP 102/70 | HR 64 | Temp 98.2°F | Wt 200.9 lb

## 2018-01-19 DIAGNOSIS — Z7189 Other specified counseling: Secondary | ICD-10-CM | POA: Diagnosis not present

## 2018-01-19 DIAGNOSIS — Z23 Encounter for immunization: Secondary | ICD-10-CM

## 2018-01-19 DIAGNOSIS — Z7184 Encounter for health counseling related to travel: Secondary | ICD-10-CM

## 2018-01-19 MED ORDER — CIPROFLOXACIN HCL 500 MG PO TABS
500.0000 mg | ORAL_TABLET | Freq: Two times a day (BID) | ORAL | 0 refills | Status: DC
Start: 2018-01-19 — End: 2018-09-20

## 2018-01-19 MED ORDER — TYPHOID VACCINE PO CPDR: DELAYED_RELEASE_CAPSULE | ORAL | 0 refills | Status: DC

## 2018-01-19 MED ORDER — ATOVAQUONE-PROGUANIL HCL 250-100 MG PO TABS: ORAL_TABLET | ORAL | 0 refills | Status: DC

## 2018-01-19 MED FILL — ATOVAQUONE-PROGUANIL 250-10: 250-100 | 21 days supply | Qty: 21 | Fill #0

## 2018-01-19 MED FILL — CIPROFLOXACIN HCL 500 MG TA: 500 | 3 days supply | Qty: 6 | Fill #0

## 2018-01-19 MED FILL — VIVOTIF EC CAPSULE: 8 days supply | Qty: 4 | Fill #0

## 2018-01-19 NOTE — Patient Instructions (Signed)
Food Poisoning and Traveling Food poisoning is an illness caused by organisms present in something you ate or drank. Some types of food poisoning trigger symptoms quickly. Others may take 1-2 weeks for symptoms to appear. Symptoms of food poisoning include:  Diarrhea.  Cramping.  Fever.  Vomiting.  Dizziness.  Aches and pains.  Before you travel, learn as much as you can about the foodborne illnesses that are common in the areas where you are going. The risk for food poisoning varies from country to country and from one region of the world to another. Countries with a low risk include:  The [place].  [place].  [place].  [place].  [place].  Some countries in [place].  Countries with a mid-range risk include:  Countries in [place].  [place].  Some [place].  Countries with a high risk include:  Countries in [place].  Countries in the [place] in [place].  [place].  Countries in [place] and [place].  What types of illness can be passed through food and drinks? Most cases of food poisoning are caused by bacteria or viruses, such as:  E. coli.  Campylobacteriosis.  Shigellosis.  Salmonellosis.  Norovirus.  Rotavirus.  Astrovirus.  Food poisoning can also be caused by some microscopic parasites. These are organisms that live off of another larger organism. Illness caused by parasites can take 1-2 weeks to appear and may last several months. The illnesses include:  Giardiasis.  Amebiasis.  Cyclosporiasis.  Cryptosporidiosis.  Medicines are available to treat these infections. How can I decrease my risk of food poisoning while traveling? Good hand hygiene always helps protect your health. Carry small bottles of alcohol-based hand sanitizer. Use it to clean your hands before you eat. Also follow these basic guidelines for eating and drinking while traveling. Foods that are generally safe to  eat:  Food that is thoroughly cooked.  Food that is served hot.  Hard-boiled eggs.  Fruits and vegetables you wash and peel yourself.  Milk or cheese that is treated with high heat (pasteurized).  Foods to avoid:  Raw or undercooked foods.  Raw or runny eggs.  Food that is not hot (such as food that has been on a buffet or picnic table for a while).  Raw fruits or vegetables that have not been washed and peeled.  Other items made with fresh vegetables or fruits, like salad and salsa.  Milk or cheese that is not pasteurized.  Meat from local animals, such as monkeys and bats.  Drinks that are generally safe:  Bottled waters, soda, or sports drinks.  Drinks you know were sealed until you opened them.  Water you know has been treated, boiled, or filtered to remove microorganisms.  Ice from treated or bottled water.  Drinks made with boiling water, such as tea or coffee.  Pasteurized milk.  Drinks to avoid:  Water from the tap or a well.  Water from a fresh water source, such as a stream.  Ice from a tap, well, or fresh water source.  Beverages that include water from a well, tap, or fresh water source.  Milk that is not pasteurized.  Beverages from soda fountains.  What should I do if I think I have developed food poisoning while traveling?  If you have been vomiting or have diarrhea, drink water or other fluids to replace what you lost.  Take over-the-counter medicine to stop diarrhea. Consider packing a supply of antidiarrheal medicine to take with you.  Contact your health care provider if your symptoms do not clear up after a few days. How is food poisoning treated? Most cases of food poisoning go away without treatment within 48 hours. Food poisoning caused by bacteria may be treated with antibiotic medicines.Viruses cannot be treated with antibiotics.Illnesses caused by parasites might respond to antiparasitic medicines. You can also treat symptoms  of food poisoning with medicines to:  Prevent or slow diarrhea (antidiarrheals).  Rehydrate your body. Oral rehydration therapy (ORT) helps your body replace fluids and electrolytes lost in diarrhea or vomit.  Treat rashes caused by diarrhea using hydrocortisone cream.  Should I be proactively treated for food poisoning before I travel? Talk to your health care provider about your personal risk for contracting food poisoning while traveling. Discuss the foodborne illnesses that are common in the areas you plan to visit. Make sure you understand how to use any medicines your health care provider recommends. Some medicines can help prevent food poisoning. These include:  Bismuth subsalicylate (BSS). This is an ingredient in over-the-counter antidiarrheal medicines. It might help some adult travelers prevent illness.  Probiotics or "good" bacteria. Taking probiotics might help prevent some illnesses.  Antibiotics. These protect against bacteria only. Taking antibiotics to prevent food poisoning is usually suggested only for people who have a compromised immune system.  How can I learn more?  Visit a travel medicine clinic or speak with a health care provider who specializes in travel medicine as soon as you know your travel plans.  Check the Travelers' Health section on the website of the Centers for Disease Control and Prevention (CDC): [employer].ee This information is not intended to replace advice given to you by your health care provider. Make sure you discuss any questions you have with your health care provider. Document Released: 07/25/2002 Document Revised: 10/01/2015 Document Reviewed: 08/11/2013 Elsevier Interactive Patient Education  2018 [employer].

## 2018-01-19 NOTE — Addendum Note (Signed)
Addended by: Westley Hummer B on: 01/19/2018 12:03 PM   Modules accepted: Orders

## 2018-01-19 NOTE — Progress Notes (Signed)
  Subjective:     Patient ID: [name], male   DOB: 18-Jun-1960, 57 y.o.   MRN: [id number]  HPI Here for travel consultation. He will be traveling to [place] in October. He'll be on a hunting safari. He's had one previous hepatitis A. His tetanus is up-to-date. He will need typhoid prevention as well as malaria prevention. Other immunizations are up-to-date. He states he's had 2 previous measles months and rubella vaccines.  Past Medical History:  Diagnosis Date  . Asymptomatic LV dysfunction    Echocardiogram 07/08/10: EF 40-45% with normal wall motion; question athlete's heart  . Cancer (HCC)    skin-basel cell nose  . Chest pain    Cardiac catheterization 07/07/10: Normal coronary arteries; EF 65%;                             GI workup inconclusive with a normal abdominal ultrasound and normal HIDA scan  . Hx of adenomatous polyp of colon 08/30/2014  . Mobitz (type) I (Wenckebach's) atrioventricular block    Documented during sleep during admission for chest pain 07/06/10 to 07/10/10  . Myocardial infarction [facility]) 07-06-2010   Past Surgical History:  Procedure Laterality Date  . CARDIAC CATHETERIZATION  2012  . COLONOSCOPY  2016  . None      reports that he quit smoking about 37 years ago. His smoking use included cigarettes. He has a 1.50 pack-year smoking history. He has never used smokeless tobacco. He reports that he drinks alcohol. He reports that he does not use drugs. family history includes Cancer in his paternal grandfather; Colon cancer in his paternal grandfather; Colon polyps in his father; Diabetes in his maternal grandfather; Heart disease (age of onset: 61) in his father; Hyperlipidemia in his father, maternal grandfather, maternal grandmother, mother, paternal grandfather, and paternal grandmother; Stroke in his father. No Known Allergies   Review of Systems  Constitutional: Negative for chills and fever.  Gastrointestinal: Negative for diarrhea.      Objective:   Physical Exam  Constitutional: He appears well-developed and well-nourished.  Cardiovascular: Normal rate and regular rhythm.       Assessment:     Travel advice encounter    Plan:     -recommend second hepatitis A vaccine -Recommend oral typhoid vaccine -Malarone 250 mg take 1 daily starting 1 day prior travel, during travel, and for 1 week after return -Wrote for limited Cipro as needed for traveler's diarrhea -Discussed importance of safety with water and food intake  Eulas Post MD [facility]

## 2018-01-30 MED FILL — CHLORHEXIDINE 0.12% RINSE: 0.12 | 16 days supply | Qty: 473 | Fill #6

## 2018-01-31 MED FILL — PANTOPRAZOLE SOD DR 40 MG T: 40 | 90 days supply | Qty: 90 | Fill #2

## 2018-02-03 ENCOUNTER — Other Ambulatory Visit: Payer: Self-pay | Admitting: Family Medicine

## 2018-02-03 NOTE — Telephone Encounter (Signed)
Not clear why this would need to be refilled?   This was just prescribed for his upcoming trip to [place].

## 2018-02-04 NOTE — Telephone Encounter (Signed)
I left a message for the pt to return my call.  CRM also created. 

## 2018-03-31 DIAGNOSIS — L821 Other seborrheic keratosis: Secondary | ICD-10-CM | POA: Diagnosis not present

## 2018-03-31 DIAGNOSIS — L812 Freckles: Secondary | ICD-10-CM | POA: Diagnosis not present

## 2018-03-31 DIAGNOSIS — L718 Other rosacea: Secondary | ICD-10-CM | POA: Diagnosis not present

## 2018-03-31 DIAGNOSIS — D2261 Melanocytic nevi of right upper limb, including shoulder: Secondary | ICD-10-CM | POA: Diagnosis not present

## 2018-03-31 DIAGNOSIS — Z85828 Personal history of other malignant neoplasm of skin: Secondary | ICD-10-CM | POA: Diagnosis not present

## 2018-03-31 DIAGNOSIS — L820 Inflamed seborrheic keratosis: Secondary | ICD-10-CM | POA: Diagnosis not present

## 2018-03-31 DIAGNOSIS — D225 Melanocytic nevi of trunk: Secondary | ICD-10-CM | POA: Diagnosis not present

## 2018-04-01 MED FILL — PIMECROLIMUS 1 % CREA: 1 | 15 days supply | Qty: 30 | Fill #0

## 2018-04-25 MED FILL — PANTOPRAZOLE SOD DR 40 MG T: 40 | 90 days supply | Qty: 90 | Fill #3

## 2018-07-18 ENCOUNTER — Other Ambulatory Visit: Payer: Self-pay | Admitting: Family Medicine

## 2018-07-18 MED FILL — PANTOPRAZOLE SOD DR 40 MG T: 40 | 30 days supply | Qty: 30 | Fill #0

## 2018-08-19 MED FILL — PIMECROLIMUS 1 % CREA: 1 | 30 days supply | Qty: 60 | Fill #2

## 2018-08-19 MED FILL — CHLORHEXIDINE 0.12% RINSE: 0.12 | 16 days supply | Qty: 473 | Fill #0

## 2018-08-20 ENCOUNTER — Other Ambulatory Visit: Payer: Self-pay | Admitting: Family Medicine

## 2018-08-22 NOTE — Telephone Encounter (Signed)
Left message for patient to call back and schedule a appointment for further refills.  CRM created.

## 2018-08-30 MED FILL — CHLORHEXIDINE 0.12% RINSE: 0.12 | 16 days supply | Qty: 473 | Fill #1

## 2018-09-14 MED FILL — CHLORHEXIDINE 0.12% RINSE: 0.12 | 16 days supply | Qty: 473 | Fill #2

## 2018-09-20 ENCOUNTER — Other Ambulatory Visit: Payer: Self-pay

## 2018-09-20 ENCOUNTER — Ambulatory Visit (INDEPENDENT_AMBULATORY_CARE_PROVIDER_SITE_OTHER): Payer: 59 | Admitting: Family Medicine

## 2018-09-20 DIAGNOSIS — K219 Gastro-esophageal reflux disease without esophagitis: Secondary | ICD-10-CM | POA: Diagnosis not present

## 2018-09-20 MED ORDER — PANTOPRAZOLE SODIUM 40 MG PO TBEC: 40.0000 mg | DELAYED_RELEASE_TABLET | Freq: Every day | ORAL | 3 refills | Status: DC

## 2018-09-20 MED FILL — PANTOPRAZOLE SOD DR 40 MG T: 40 | 90 days supply | Qty: 90 | Fill #0

## 2018-09-20 NOTE — Progress Notes (Signed)
This visit type was conducted due to national recommendations for restrictions regarding the COVID-19 pandemic in an effort to limit this patient's exposure and mitigate transmission in our community.   Virtual Visit via Video Note  I connected with Shelda Altes on 09/20/18 at 11:30 AM EDT by a video enabled telemedicine application and verified that I am speaking with the correct person using two identifiers.  Location patient: home Location provider:work or home office Persons participating in the virtual visit: patient, provider  I discussed the limitations of evaluation and management by telemedicine and the availability of in person appointments. The patient expressed understanding and agreed to proceed.   HPI: Patient has history of GERD.  He takes Protonix 40 mg daily.  Requesting refills.  Appetite and weight stable.  Has had some very mild weight gain over the past couple months since his gym is been closed.  Denies any recent dysphagia.  Takes no other regular medications.   ROS: See pertinent positives and negatives per HPI.  Past Medical History:  Diagnosis Date  . Asymptomatic LV dysfunction    Echocardiogram 07/08/10: EF 40-45% with normal wall motion; question athlete's heart  . Cancer (HCC)    skin-basel cell nose  . Chest pain    Cardiac catheterization 07/07/10: Normal coronary arteries; EF 65%;                             GI workup inconclusive with a normal abdominal ultrasound and normal HIDA scan  . Hx of adenomatous polyp of colon 08/30/2014  . Mobitz (type) I (Wenckebach's) atrioventricular block    Documented during sleep during admission for chest pain 07/06/10 to 07/10/10  . Myocardial infarction [facility]) 07-06-2010    Past Surgical History:  Procedure Laterality Date  . CARDIAC CATHETERIZATION  2012  . COLONOSCOPY  2016  . None      Family History  Problem Relation Age of Onset  . Hyperlipidemia Mother   . Hyperlipidemia Father   . Stroke Father         2010  . Heart disease Father 66       atrial fibrillation  . Colon polyps Father   . Hyperlipidemia Maternal Grandmother   . Hyperlipidemia Maternal Grandfather   . Diabetes Maternal Grandfather   . Hyperlipidemia Paternal Grandmother   . Cancer Paternal Grandfather        colon  . Hyperlipidemia Paternal Grandfather   . Colon cancer Paternal Grandfather   . Esophageal cancer Neg Hx   . Stomach cancer Neg Hx     SOCIAL HX: Quit smoking 1982   Current Outpatient Medications:  .  ibuprofen (ADVIL,MOTRIN) 200 MG tablet, Take 800 mg by mouth daily as needed (pain)., Disp: , Rfl:  .  meloxicam (MOBIC) 15 MG tablet, Take 1 tablet (15 mg total) by mouth daily., Disp: 30 tablet, Rfl: 1 .  pantoprazole (PROTONIX) 40 MG tablet, Take 1 tablet (40 mg total) by mouth daily., Disp: 90 tablet, Rfl: 3 .  pimecrolimus (ELIDEL) 1 % cream, Apply topically 2 (two) times daily as needed., Disp: 30 g, Rfl: 1 .  triamcinolone cream (KENALOG) 0.1 %, Apply twice daily to affected rash, Disp: 15 g, Rfl: 1  EXAM:  VITALS per patient if applicable:  GENERAL: alert, oriented, appears well and in no acute distress  HEENT: atraumatic, conjunttiva clear, no obvious abnormalities on inspection of external nose and ears  NECK: normal movements of the head  and neck  LUNGS: on inspection no signs of respiratory distress, breathing rate appears normal, no obvious gross SOB, gasping or wheezing  CV: no obvious cyanosis  MS: moves all visible extremities without noticeable abnormality  PSYCH/NEURO: pleasant and cooperative, no obvious depression or anxiety, speech and thought processing grossly intact  ASSESSMENT AND PLAN:  Discussed the following assessment and plan:  Gastroesophageal reflux disease without esophagitis Stable  -Refill Protonix 40 mg daily for 1 year -Patient will consider setting up complete physical for later this year    I discussed the assessment and treatment plan with the  patient. The patient was provided an opportunity to ask questions and all were answered. The patient agreed with the plan and demonstrated an understanding of the instructions.   The patient was advised to call back or seek an in-person evaluation if the symptoms worsen or if the condition fails to improve as anticipated.   Carolann Littler, MD

## 2018-09-20 NOTE — Telephone Encounter (Signed)
Called patient and he has an appointment today

## 2018-11-22 ENCOUNTER — Other Ambulatory Visit: Payer: Self-pay | Admitting: *Deleted

## 2018-11-22 DIAGNOSIS — Z20822 Contact with and (suspected) exposure to covid-19: Secondary | ICD-10-CM

## 2018-11-26 LAB — NOVEL CORONAVIRUS, NAA: SARS-CoV-2, NAA: NOT DETECTED

## 2018-12-13 MED FILL — PANTOPRAZOLE SOD DR 40 MG T: 40 | 90 days supply | Qty: 90 | Fill #1

## 2019-03-13 MED FILL — PANTOPRAZOLE SOD DR 40 MG T: 40 | 90 days supply | Qty: 90 | Fill #2

## 2019-05-02 DIAGNOSIS — Z20828 Contact with and (suspected) exposure to other viral communicable diseases: Secondary | ICD-10-CM | POA: Diagnosis not present

## 2019-06-11 MED FILL — PANTOPRAZOLE SOD DR 40 MG T: 40 | 90 days supply | Qty: 90 | Fill #3

## 2019-09-09 ENCOUNTER — Other Ambulatory Visit: Payer: Self-pay | Admitting: Family Medicine

## 2019-09-10 ENCOUNTER — Telehealth: Payer: Self-pay | Admitting: Internal Medicine

## 2019-09-10 DIAGNOSIS — R000 Tachycardia, unspecified: Secondary | ICD-10-CM

## 2019-09-10 DIAGNOSIS — I499 Cardiac arrhythmia, unspecified: Secondary | ICD-10-CM

## 2019-09-10 DIAGNOSIS — R0600 Dyspnea, unspecified: Secondary | ICD-10-CM

## 2019-09-10 NOTE — Telephone Encounter (Signed)
The patient contacted me today stating that he has noticed his heart rates frequently > 100 bpm with sats at home low 90s for 4 days.  Subjectively SOB, though able to exercise 4 days last week and use his [place] twice this past week without any symptoms. Denies recent surgery, travel, or risk factors for DVT/PTE.  Denies fevers, chills, cough, or COVID symptoms.  He did send me an apple watch rhythm strip this evening which reveals sinus rhythm, 70s.  He has a remote history of CAD and no recent workup.   Plan: 1. Cbc, bmet, tsh, BNP, d dimer, ekg,fasting lipids, and CXR to evaluate tachycardia and low sats in am. He works at [employer] and may prefer to have these done closer to [employer] if possible.  2. Echo, cardiac CT to evaluate SOB  3. Set up virtual visit with me this week.  Thompson Grayer MD, [place] 09/10/2019 10:22 PM

## 2019-09-11 ENCOUNTER — Ambulatory Visit (INDEPENDENT_AMBULATORY_CARE_PROVIDER_SITE_OTHER): Payer: 59

## 2019-09-11 ENCOUNTER — Other Ambulatory Visit: Payer: Self-pay

## 2019-09-11 ENCOUNTER — Other Ambulatory Visit: Payer: 59 | Admitting: *Deleted

## 2019-09-11 DIAGNOSIS — R0602 Shortness of breath: Secondary | ICD-10-CM | POA: Diagnosis not present

## 2019-09-11 DIAGNOSIS — R000 Tachycardia, unspecified: Secondary | ICD-10-CM | POA: Diagnosis not present

## 2019-09-11 DIAGNOSIS — I499 Cardiac arrhythmia, unspecified: Secondary | ICD-10-CM | POA: Diagnosis not present

## 2019-09-11 LAB — D-DIMER, QUANTITATIVE: D-DIMER: 0.29 mg/L FEU (ref 0.00–0.49)

## 2019-09-11 MED FILL — PANTOPRAZOLE SOD DR 40 MG T: 40 | 90 days supply | Qty: 90 | Fill #0

## 2019-09-11 NOTE — Telephone Encounter (Signed)
Call placed to Pt.  Will come to [place] office this morning for labs/EKG.

## 2019-09-11 NOTE — Progress Notes (Signed)
1.) Reason for visit: tachycardia/sob  2.) Name of MD requesting visit: Dr. Rayann Heman  3.) H&P: pt contacted Dr. Rayann Heman with tachycardia/ sob  4.) ROS related to problem: Pt states he feels fine today.  States that he has had some recent inappropriate tachycardia  5.) Assessment and plan per MD:  Performed EKG.  Pt is getting lab work today.  Pt will get chest Xray next.  Order placed for ECHO and cardiac CT.  Will schedule Pt for a virtual visit this Friday.

## 2019-09-11 NOTE — Telephone Encounter (Signed)
Order placed for labs/chest xray/nurse visit scheduled for EKG  Order placed for ECHO and cardiac CT  Will forward to scheduling to set up virtual visit on Friday.

## 2019-09-12 ENCOUNTER — Other Ambulatory Visit: Payer: Self-pay | Admitting: Family Medicine

## 2019-09-12 LAB — BASIC METABOLIC PANEL
BUN/Creatinine Ratio: 13 (ref 9–20)
BUN: 14 mg/dL (ref 6–24)
CO2: 24 mmol/L (ref 20–29)
Calcium: 9.5 mg/dL (ref 8.7–10.2)
Chloride: 102 mmol/L (ref 96–106)
Creatinine, Ser: 1.1 mg/dL (ref 0.76–1.27)
GFR calc Af Amer: 86 mL/min/{1.73_m2} (ref >59)
GFR calc non Af Amer: 75 mL/min/{1.73_m2} (ref >59)
Glucose: 102 mg/dL — ABNORMAL HIGH (ref 65–99)
Potassium: 4.8 mmol/L (ref 3.5–5.2)
Sodium: 138 mmol/L (ref 134–144)

## 2019-09-12 LAB — CBC WITH DIFFERENTIAL/PLATELET
Basophils Absolute: 0.1 10*3/uL (ref 0.0–0.2)
Basos: 1 %
EOS (ABSOLUTE): 0.2 10*3/uL (ref 0.0–0.4)
Eos: 5 %
Hematocrit: 41.3 % (ref 37.5–51.0)
Hemoglobin: 14.2 g/dL (ref 13.0–17.7)
Immature Grans (Abs): 0 10*3/uL (ref 0.0–0.1)
Immature Granulocytes: 0 %
Lymphocytes Absolute: 1.5 10*3/uL (ref 0.7–3.1)
Lymphs: 33 %
MCH: 32.8 pg (ref 26.6–33.0)
MCHC: 34.4 g/dL (ref 31.5–35.7)
MCV: 95 fL (ref 79–97)
Monocytes Absolute: 0.5 10*3/uL (ref 0.1–0.9)
Monocytes: 11 %
Neutrophils Absolute: 2.3 10*3/uL (ref 1.4–7.0)
Neutrophils: 50 %
Platelets: 317 10*3/uL (ref 150–450)
RBC: 4.33 x10E6/uL (ref 4.14–5.80)
RDW: 11.5 % — ABNORMAL LOW (ref 11.6–15.4)
WBC: 4.6 10*3/uL (ref 3.4–10.8)

## 2019-09-12 LAB — LIPID PANEL
Chol/HDL Ratio: 3.3 ratio (ref 0.0–5.0)
Cholesterol, Total: 188 mg/dL (ref 100–199)
HDL: 57 mg/dL (ref >39)
LDL Chol Calc (NIH): 116 mg/dL — ABNORMAL HIGH (ref 0–99)
Triglycerides: 79 mg/dL (ref 0–149)
VLDL Cholesterol Cal: 15 mg/dL (ref 5–40)

## 2019-09-12 LAB — PRO B NATRIURETIC PEPTIDE: NT-Pro BNP: 27 pg/mL (ref 0–210)

## 2019-09-12 LAB — TSH: TSH: 1.45 u[IU]/mL (ref 0.450–4.500)

## 2019-09-13 ENCOUNTER — Telehealth: Payer: Self-pay

## 2019-09-15 ENCOUNTER — Encounter: Payer: Self-pay | Admitting: Internal Medicine

## 2019-09-15 ENCOUNTER — Telehealth (INDEPENDENT_AMBULATORY_CARE_PROVIDER_SITE_OTHER): Payer: 59 | Admitting: Internal Medicine

## 2019-09-15 VITALS — BP 156/74 | HR 86 | Ht 73.0 in | Wt 197.0 lb

## 2019-09-15 DIAGNOSIS — R0602 Shortness of breath: Secondary | ICD-10-CM

## 2019-09-15 DIAGNOSIS — R000 Tachycardia, unspecified: Secondary | ICD-10-CM | POA: Diagnosis not present

## 2019-09-15 NOTE — Progress Notes (Signed)
Electrophysiology TeleHealth Note   Due to national recommendations of social distancing due to COVID 19, an audio/video telehealth visit is felt to be most appropriate for this patient at this time.  See MyChart message from today for the patient's consent to telehealth for [facility].  Date:  09/15/2019   ID:  [name] Herman, DOB 1960/09/30, MRN [id number]  Location: patient's home  Provider location:  [place]  Evaluation Performed: Follow-up visit  PCP:  Eulas Post, MD   Electrophysiologist:  Dr Rayann Heman  Chief Complaint:  palpitations  History of Present Illness:    [name] Herman is a 59 y.o. male who presents via telehealth conferencing today.  He recently developed symptomatic sinus tachycardia with SOB.  He found his sats to be low 90s by pulse ox.  He is active.  He exercises regularly without limitation.  He is concerned about his recent dyspnea.  Today, he denies symptoms of palpitations, chest pain,  lower extremity edema, dizziness, presyncope, or syncope.  The patient is otherwise without complaint today.   Past Medical History:  Diagnosis Date  . Asymptomatic LV dysfunction    Echocardiogram 07/08/10: EF 40-45% with normal wall motion; question athlete's heart  . Atypical chest pain    Cardiac catheterization 07/07/10: Normal coronary arteries; EF 65%;                             GI workup inconclusive with a normal abdominal ultrasound and normal HIDA scan  . Cancer (HCC)    skin-basel cell nose  . Hx of adenomatous polyp of colon 08/30/2014  . Myocardial infarction ([place]) 07/06/2010   normal cors    Past Surgical History:  Procedure Laterality Date  . CARDIAC CATHETERIZATION  2012  . COLONOSCOPY  2016    Current Outpatient Medications  Medication Sig Dispense Refill  . ibuprofen (ADVIL,MOTRIN) 200 MG tablet Take 800 mg by mouth daily as needed (pain).    . pantoprazole (PROTONIX) 40 MG tablet Take 1 tablet (40 mg total) by mouth daily.  NEEDS OFFICE VISIT FOR FURTHER REFILLS 90 tablet 0   No current facility-administered medications for this visit.    Allergies:   Patient has no known allergies.   Social History:  The patient  reports that he quit smoking about 39 years ago. His smoking use included cigarettes. He has a 1.50 pack-year smoking history. He has never used smokeless tobacco. He reports current alcohol use. He reports that he does not use drugs.   ROS:  Please see the history of present illness.   All other systems are personally reviewed and negative.    Exam:    Vital Signs:  BP (!) 156/74   Pulse 86   Ht 6\' 1"  (1.854 m)   Wt 197 lb (89.4 kg)   BMI 25.99 kg/m   Well sounding and appearing, alert and conversant, regular work of breathing,  good skin color Eyes- anicteric, neuro- grossly intact, skin- no apparent rash or lesions or cyanosis, mouth- oral mucosa is pink  Labs/Other Tests and Data Reviewed:    Recent Labs: 09/11/2019: BUN 14; Creatinine, Ser 1.10; Hemoglobin 14.2; NT-Pro BNP 27; Platelets 317; Potassium 4.8; Sodium 138; TSH 1.450   Wt Readings from Last 3 Encounters:  09/15/19 197 lb (89.4 kg)  01/19/18 200 lb 14.4 oz (91.1 kg)  10/20/17 198 lb (89.8 kg)      ASSESSMENT & PLAN:  1.  Sinus tachycardia with SOB/ mildly reduced sats Unclear etiology Labs are reviewed and unremarkable We will obtain echo and cardiac CT to exclude cardiac cause. If these are normal, no further CV testing is advised  2. Nonischemic CM EF was 45-50% with prior hospitalization in 2012.  Echo has not been repeated. Dr Cherlyn Cushing discharge summary (reviewed) states that this was possibly due to athlete's heart Repeat echo at this time   Follow-up:  As needed pending results of echo and cardiac CT   Patient Risk:  after full review of this patients clinical status, I feel that they are at moderate risk at this time.  Today, I have spent 15 minutes with the patient with telehealth technology  discussing arrhythmia management .    SignedThompson Grayer, MD  09/15/2019 1:40 PM     [facility] [place] [address] [phone] (office) [phone] (fax)

## 2019-09-18 MED ORDER — METOPROLOL TARTRATE 100 MG PO TABS
ORAL_TABLET | ORAL | 0 refills | Status: DC
Start: 2019-09-18 — End: 2023-09-20

## 2019-09-18 MED FILL — METOPROLOL TARTRATE 100 MG: 100 | 1 days supply | Qty: 1 | Fill #0

## 2019-09-27 ENCOUNTER — Telehealth (HOSPITAL_COMMUNITY): Payer: Self-pay | Admitting: *Deleted

## 2019-09-27 NOTE — Telephone Encounter (Signed)
Reaching out to patient to offer assistance regarding upcoming cardiac imaging study; pt verbalizes understanding of appt date/time, parking situation and where to check in, pre-test NPO status and medications ordered, and verified current allergies; name and call back number provided for further questions should they arise  [facility] [phone] office [phone] cell

## 2019-09-28 ENCOUNTER — Ambulatory Visit (HOSPITAL_COMMUNITY)
Admission: RE | Admit: 2019-09-28 | Discharge: 2019-09-28 | Disposition: A | Discharge status: Home / Self Care | Payer: 59 | Source: Ambulatory Visit | Attending: Internal Medicine | Admitting: Internal Medicine

## 2019-09-28 ENCOUNTER — Other Ambulatory Visit: Payer: Self-pay

## 2019-09-28 ENCOUNTER — Encounter (HOSPITAL_COMMUNITY): Payer: Self-pay

## 2019-09-28 DIAGNOSIS — I499 Cardiac arrhythmia, unspecified: Secondary | ICD-10-CM | POA: Diagnosis not present

## 2019-09-28 DIAGNOSIS — R0600 Dyspnea, unspecified: Secondary | ICD-10-CM | POA: Diagnosis not present

## 2019-09-28 DIAGNOSIS — I498 Other specified cardiac arrhythmias: Secondary | ICD-10-CM | POA: Diagnosis not present

## 2019-09-28 MED ORDER — NITROGLYCERIN 0.4 MG SL SUBL
SUBLINGUAL_TABLET | SUBLINGUAL | Status: AC
  Filled 2019-09-28: qty 2

## 2019-09-28 MED ORDER — IOHEXOL 300 MG/ML  SOLN
100.0000 mL | Freq: Once | INTRAMUSCULAR | Status: AC | PRN
  Administered 2019-09-28: 100 mL via INTRAVENOUS

## 2019-09-28 MED ORDER — NITROGLYCERIN 0.4 MG SL SUBL
0.8000 mg | SUBLINGUAL_TABLET | Freq: Once | SUBLINGUAL | Status: AC
  Administered 2019-09-28: 0.8 mg via SUBLINGUAL

## 2019-09-29 ENCOUNTER — Ambulatory Visit (HOSPITAL_COMMUNITY): Payer: 59 | Attending: Cardiovascular Disease

## 2019-09-29 ENCOUNTER — Other Ambulatory Visit: Payer: Self-pay

## 2019-09-29 DIAGNOSIS — R000 Tachycardia, unspecified: Secondary | ICD-10-CM | POA: Diagnosis not present

## 2019-09-29 DIAGNOSIS — I499 Cardiac arrhythmia, unspecified: Secondary | ICD-10-CM | POA: Insufficient documentation

## 2019-09-29 DIAGNOSIS — R0600 Dyspnea, unspecified: Secondary | ICD-10-CM | POA: Diagnosis not present

## 2019-12-04 ENCOUNTER — Other Ambulatory Visit: Payer: Self-pay | Admitting: Family Medicine

## 2019-12-05 MED FILL — PANTOPRAZOLE SOD DR 40 MG T: 40 | 90 days supply | Qty: 90 | Fill #0

## 2019-12-19 ENCOUNTER — Encounter: Payer: 59 | Admitting: Family Medicine

## 2019-12-20 ENCOUNTER — Other Ambulatory Visit: Payer: Self-pay

## 2019-12-20 ENCOUNTER — Ambulatory Visit (INDEPENDENT_AMBULATORY_CARE_PROVIDER_SITE_OTHER): Payer: 59 | Admitting: Family Medicine

## 2019-12-20 ENCOUNTER — Encounter: Payer: Self-pay | Admitting: Family Medicine

## 2019-12-20 VITALS — BP 122/76 | HR 55 | Temp 97.7°F | Ht 73.0 in | Wt 205.9 lb

## 2019-12-20 DIAGNOSIS — Z0000 Encounter for general adult medical examination without abnormal findings: Secondary | ICD-10-CM

## 2019-12-20 NOTE — Patient Instructions (Signed)
Preventive Care 41-57 Years Old, Male Preventive care refers to lifestyle choices and visits with your health care provider that can promote health and wellness. This includes:  A yearly physical exam. This is also called an annual well check.  Regular dental and eye exams.  Immunizations.  Screening for certain conditions.  Healthy lifestyle choices, such as eating a healthy diet, getting regular exercise, not using drugs or products that contain nicotine and tobacco, and limiting alcohol use. What can I expect for my preventive care visit? Physical exam Your health care provider will check:  Height and weight. These may be used to calculate body mass index (BMI), which is a measurement that tells if you are at a healthy weight.  Heart rate and blood pressure.  Your skin for abnormal spots. Counseling Your health care provider may ask you questions about:  Alcohol, tobacco, and drug use.  Emotional well-being.  Home and relationship well-being.  Sexual activity.  Eating habits.  Work and work Statistician. What immunizations do I need?  Influenza (flu) vaccine  This is recommended every year. Tetanus, diphtheria, and pertussis (Tdap) vaccine  You may need a Td booster every 10 years. Varicella (chickenpox) vaccine  You may need this vaccine if you have not already been vaccinated. Zoster (shingles) vaccine  You may need this after age 57. Measles, mumps, and rubella (MMR) vaccine  You may need at least one dose of MMR if you were born in 1957 or later. You may also need a second dose. Pneumococcal conjugate (PCV13) vaccine  You may need this if you have certain conditions and were not previously vaccinated. Pneumococcal polysaccharide (PPSV23) vaccine  You may need one or two doses if you smoke cigarettes or if you have certain conditions. Meningococcal conjugate (MenACWY) vaccine  You may need this if you have certain conditions. Hepatitis A  vaccine  You may need this if you have certain conditions or if you travel or work in places where you may be exposed to hepatitis A. Hepatitis B vaccine  You may need this if you have certain conditions or if you travel or work in places where you may be exposed to hepatitis B. Haemophilus influenzae type b (Hib) vaccine  You may need this if you have certain risk factors. Human papillomavirus (HPV) vaccine  If recommended by your health care provider, you may need three doses over 6 months. You may receive vaccines as individual doses or as more than one vaccine together in one shot (combination vaccines). Talk with your health care provider about the risks and benefits of combination vaccines. What tests do I need? Blood tests  Lipid and cholesterol levels. These may be checked every 5 years, or more frequently if you are over 57 years old.  Hepatitis C test.  Hepatitis B test. Screening  Lung cancer screening. You may have this screening every year starting at age 57 if you have a 30-pack-year history of smoking and currently smoke or have quit within the past 15 years.  Prostate cancer screening. Recommendations will vary depending on your family history and other risks.  Colorectal cancer screening. All adults should have this screening starting at age 57 and continuing until age 57. Your health care provider may recommend screening at age 57 if you are at increased risk. You will have tests every 1-10 years, depending on your results and the type of screening test.  Diabetes screening. This is done by checking your blood sugar (glucose) after you have not eaten  for a while (fasting). You may have this done every 1-3 years.  Sexually transmitted disease (STD) testing. Follow these instructions at home: Eating and drinking  Eat a diet that includes fresh fruits and vegetables, whole grains, lean protein, and low-fat dairy products.  Take vitamin and mineral supplements as  recommended by your health care provider.  Do not drink alcohol if your health care provider tells you not to drink.  If you drink alcohol: ? Limit how much you have to 0-2 drinks a day. ? Be aware of how much alcohol is in your drink. In the [place] one drink equals one 12 oz bottle of beer (355 mL), one 5 oz glass of wine (148 mL), or one 1 oz glass of hard liquor (44 mL). Lifestyle  Take daily care of your teeth and gums.  Stay active. Exercise for at least 30 minutes on 5 or more days each week.  Do not use any products that contain nicotine or tobacco, such as cigarettes, e-cigarettes, and chewing tobacco. If you need help quitting, ask your health care provider.  If you are sexually active, practice safe sex. Use a condom or other form of protection to prevent STIs (sexually transmitted infections).  Talk with your health care provider about taking a low-dose aspirin every day starting at age 57. What's next?  Go to your health care provider once a year for a well check visit.  Ask your health care provider how often you should have your eyes and teeth checked.  Stay up to date on all vaccines. This information is not intended to replace advice given to you by your health care provider. Make sure you discuss any questions you have with your health care provider. Document Revised: 04/28/2018 Document Reviewed: 04/28/2018 Elsevier Patient Education  2020 [employer].

## 2019-12-20 NOTE — Progress Notes (Signed)
Established Patient Office Visit  Subjective:  Patient ID: [name] Herman, male    DOB: Jun 27, 1960  Age: 59 y.o. MRN: [id number]  CC:  Chief Complaint  Patient presents with  . Annual Exam    pt has no other concerns     HPI [name] Herman presents for physical exam..  And he had couple episodes this past year where his heart rate went up at rest low 100 range and also couple episodes of exercise with heart rate up to run 170 and took some time coming back down.  He saw cardiologist and had fairly extensive work-up with echocardiogram and CT morphology study which were normal.  He had labs including lipid, TSH, basic metabolic panel, CBC, D-dimer that were all normal.  He feels well at this time.  No recent chest pains.  Major complaint is gained about 5 pounds as had difficulty losing this.  He has noticed some increased distribution of body fat around the abdomen and has difficulty losing this.  He does exercise regularly.  Does have some occasional fatigue.  Decent libido.  He wonders if his testosterone levels may be low.  No history of shingles vaccine and declines at this time.  His tetanus is up-to-date.  Colonoscopy up-to-date.  Has receive Covid vaccine.   Past Medical History:  Diagnosis Date  . Asymptomatic LV dysfunction    Echocardiogram 07/08/10: EF 40-45% with normal wall motion; question athlete's heart  . Atypical chest pain    Cardiac catheterization 07/07/10: Normal coronary arteries; EF 65%;                             GI workup inconclusive with a normal abdominal ultrasound and normal HIDA scan  . Cancer (HCC)    skin-basel cell nose  . Hx of adenomatous polyp of colon 08/30/2014  . Myocardial infarction ([place]) 07/06/2010   normal cors    Past Surgical History:  Procedure Laterality Date  . CARDIAC CATHETERIZATION  2012  . COLONOSCOPY  2016    Family History  Problem Relation Age of Onset  . Hyperlipidemia Mother   . Hyperlipidemia Father   . Stroke  Father        2010  . Heart disease Father 73       atrial fibrillation  . Colon polyps Father   . Hyperlipidemia Maternal Grandmother   . Hyperlipidemia Maternal Grandfather   . Diabetes Maternal Grandfather   . Hyperlipidemia Paternal Grandmother   . Cancer Paternal Grandfather        colon  . Hyperlipidemia Paternal Grandfather   . Colon cancer Paternal Grandfather   . Esophageal cancer Neg Hx   . Stomach cancer Neg Hx     Social History   Socioeconomic History  . Marital status: Married    Spouse name: Not on file  . Number of children: Not on file  . Years of education: Not on file  . Highest education level: Not on file  Occupational History  . Not on file  Tobacco Use  . Smoking status: Former Smoker    Packs/day: 0.50    Years: 3.00    Pack years: 1.50    Types: Cigarettes    Quit date: 07/30/1980    Years since quitting: 39.4  . Smokeless tobacco: Never Used  Vaping Use  . Vaping Use: Never used  Substance and Sexual Activity  . Alcohol use: Yes    Alcohol/week:  0.0 standard drinks    Comment: occ < weekly-social   . Drug use: No  . Sexual activity: Not on file  Other Topics Concern  . Not on file  Social History Narrative   The patient is married.  He is a Software engineer.  He has a     He has not smoked since high school.       Social Determinants of Health   Financial Resource Strain:   . Difficulty of Paying Living Expenses:   Food Insecurity:   . Worried About Charity fundraiser in the Last Year:   . Arboriculturist in the Last Year:   Transportation Needs:   . Film/video editor (Medical):   Marland Kitchen Lack of Transportation (Non-Medical):   Physical Activity:   . Days of Exercise per Week:   . Minutes of Exercise per Session:   Stress:   . Feeling of Stress :   Social Connections:   . Frequency of Communication with Friends and Family:   . Frequency of Social Gatherings with Friends and Family:   . Attends Religious Services:   . Active  Member of Clubs or Organizations:   . Attends Archivist Meetings:   Marland Kitchen Marital Status:   Intimate Partner Violence:   . Fear of Current or Ex-Partner:   . Emotionally Abused:   Marland Kitchen Physically Abused:   . Sexually Abused:     Outpatient Medications Prior to Visit  Medication Sig Dispense Refill  . pantoprazole (PROTONIX) 40 MG tablet TAKE 1 TABLET BY MOUTH DAILY **NEEDS OFFICE VISIT FOR FURTHER REFILLS** 90 tablet 0  . ibuprofen (ADVIL,MOTRIN) 200 MG tablet Take 800 mg by mouth daily as needed (pain). (Patient not taking: Reported on 12/20/2019)    . metoprolol tartrate (LOPRESSOR) 100 MG tablet Take one tablet by mouth 2 hours prior to cardiac CT if heart rate is greater than 55 beats per minute (Patient not taking: Reported on 12/20/2019) 1 tablet 0   No facility-administered medications prior to visit.    No Known Allergies  ROS Review of Systems  Constitutional: Positive for unexpected weight change. Negative for activity change, appetite change, chills and fever.  HENT: Negative for congestion, ear pain and trouble swallowing.   Eyes: Negative for pain and visual disturbance.  Respiratory: Negative for cough, shortness of breath and wheezing.   Cardiovascular: Negative for chest pain and palpitations.  Gastrointestinal: Negative for abdominal distention, abdominal pain, blood in stool, constipation, diarrhea, nausea, rectal pain and vomiting.  Genitourinary: Negative for dysuria, hematuria and testicular pain.  Musculoskeletal: Negative for arthralgias and joint swelling.  Skin: Negative for rash.  Neurological: Negative for dizziness, syncope and headaches.  Hematological: Negative for adenopathy.  Psychiatric/Behavioral: Negative for confusion and dysphoric mood.      Objective:    Physical Exam Constitutional:      General: He is not in acute distress.    Appearance: He is well-developed.  HENT:     Head: Normocephalic and atraumatic.     Right Ear: External  ear normal.     Left Ear: External ear normal.  Eyes:     Conjunctiva/sclera: Conjunctivae normal.     Pupils: Pupils are equal, round, and reactive to light.  Neck:     Thyroid: No thyromegaly.  Cardiovascular:     Rate and Rhythm: Normal rate and regular rhythm.     Heart sounds: Normal heart sounds. No murmur heard.   Pulmonary:     Effort:  No respiratory distress.     Breath sounds: No wheezing or rales.  Abdominal:     General: Bowel sounds are normal. There is no distension.     Palpations: Abdomen is soft. There is no mass.     Tenderness: There is no abdominal tenderness. There is no guarding or rebound.  Musculoskeletal:     Cervical back: Normal range of motion and neck supple.     Right lower leg: No edema.     Left lower leg: No edema.  Lymphadenopathy:     Cervical: No cervical adenopathy.  Skin:    Findings: No rash.  Neurological:     Mental Status: He is alert and oriented to person, place, and time.     Cranial Nerves: No cranial nerve deficit.     Deep Tendon Reflexes: Reflexes normal.     BP 122/76 (BP Location: Left Arm, Patient Position: Sitting, Cuff Size: Normal)   Pulse (!) 55   Temp 97.7 F (36.5 C) (Oral)   Ht 6\' 1"  (1.854 m)   Wt 205 lb 14.4 oz (93.4 kg)   SpO2 96%   BMI 27.17 kg/m  Wt Readings from Last 3 Encounters:  12/20/19 205 lb 14.4 oz (93.4 kg)  09/15/19 197 lb (89.4 kg)  01/19/18 200 lb 14.4 oz (91.1 kg)     Health Maintenance Due  Topic Date Due  . HIV Screening  Never done  . INFLUENZA VACCINE  12/17/2019    There are no preventive care reminders to display for this patient.  Lab Results  Component Value Date   TSH 1.450 09/11/2019   Lab Results  Component Value Date   WBC 4.6 09/11/2019   HGB 14.2 09/11/2019   HCT 41.3 09/11/2019   MCV 95 09/11/2019   PLT 317 09/11/2019   Lab Results  Component Value Date   NA 138 09/11/2019   K 4.8 09/11/2019   CO2 24 09/11/2019   GLUCOSE 102 (H) 09/11/2019   BUN 14  09/11/2019   CREATININE 1.10 09/11/2019   BILITOT 0.6 07/28/2017   ALKPHOS 45 07/28/2017   AST 15 07/28/2017   ALT 20 07/28/2017   PROT 7.3 07/28/2017   ALBUMIN 4.7 07/28/2017   CALCIUM 9.5 09/11/2019   GFR 71.75 07/28/2017   Lab Results  Component Value Date   CHOL 188 09/11/2019   Lab Results  Component Value Date   HDL 57 09/11/2019   Lab Results  Component Value Date   LDLCALC 116 (H) 09/11/2019   Lab Results  Component Value Date   TRIG 79 09/11/2019   Lab Results  Component Value Date   CHOLHDL 3.3 09/11/2019   No results found for: HGBA1C    Assessment & Plan:   Problem List Items Addressed This Visit    None    Visit Diagnoses    Physical exam    -  Primary   Relevant Orders   Hepatic function panel   PSA   Testosterone    Generally healthy 59 year old male.  Recent mild weight gain.  Reviewed recent labs per cardiology which included CBC, basic metabolic panel, TSH, lipid.  His thyroid was normal.  We discussed getting labs above.  -Continue annual flu vaccine -Covid vaccine already given -Tetanus due 2022 -Colonoscopy due 10/21/2022 -Previous hepatitis C screen negative  No orders of the defined types were placed in this encounter.   Follow-up: No follow-ups on file.    Carolann Littler, MD

## 2019-12-21 LAB — HEPATIC FUNCTION PANEL
AG Ratio: 1.7 (calc) (ref 1.0–2.5)
ALT: 17 U/L (ref 9–46)
AST: 16 U/L (ref 10–35)
Albumin: 4.5 g/dL (ref 3.6–5.1)
Alkaline phosphatase (APISO): 43 U/L (ref 35–144)
Bilirubin, Direct: 0.1 mg/dL (ref 0.0–0.2)
Globulin: 2.7 g/dL (calc) (ref 1.9–3.7)
Indirect Bilirubin: 0.4 mg/dL (calc) (ref 0.2–1.2)
Total Bilirubin: 0.5 mg/dL (ref 0.2–1.2)
Total Protein: 7.2 g/dL (ref 6.1–8.1)

## 2019-12-21 LAB — PSA: PSA: 0.3 ng/mL (ref <=4.0)

## 2019-12-21 LAB — TESTOSTERONE: Testosterone: 547 ng/dL (ref 250–827)

## 2019-12-22 ENCOUNTER — Encounter: Payer: 59 | Admitting: Family Medicine

## 2020-01-27 MED FILL — FLUARIX QUADRIVALENT 0.5 ML: 0.5 | 1 days supply | Qty: 1 | Fill #0

## 2020-02-21 ENCOUNTER — Other Ambulatory Visit: Payer: Self-pay | Admitting: Family Medicine

## 2020-02-21 MED FILL — PANTOPRAZOLE SOD DR 40 MG T: 40 | 30 days supply | Qty: 30 | Fill #0

## 2020-02-23 ENCOUNTER — Encounter: Payer: Self-pay | Admitting: Family Medicine

## 2020-02-23 ENCOUNTER — Other Ambulatory Visit: Payer: Self-pay | Admitting: Family Medicine

## 2020-02-23 MED ORDER — PANTOPRAZOLE SODIUM 40 MG PO TBEC: 40.0000 mg | DELAYED_RELEASE_TABLET | Freq: Every day | ORAL | 3 refills | Status: DC

## 2020-03-18 MED FILL — PANTOPRAZOLE SOD DR 40 MG T: 40 | 90 days supply | Qty: 90 | Fill #0

## 2020-04-10 ENCOUNTER — Encounter: Payer: Self-pay | Admitting: Family Medicine

## 2020-06-10 MED FILL — PANTOPRAZOLE SOD DR 40 MG T: 40 | 90 days supply | Qty: 90 | Fill #1

## 2020-08-29 ENCOUNTER — Other Ambulatory Visit (HOSPITAL_COMMUNITY): Payer: Self-pay

## 2020-09-05 ENCOUNTER — Other Ambulatory Visit (HOSPITAL_COMMUNITY): Payer: Self-pay

## 2020-09-05 MED FILL — Pantoprazole Sodium EC Tab 40 MG (Base Equiv): ORAL | 90 days supply | Qty: 90 | Fill #0 | Status: AC

## 2020-09-06 ENCOUNTER — Other Ambulatory Visit (HOSPITAL_COMMUNITY): Payer: Self-pay

## 2020-09-17 ENCOUNTER — Other Ambulatory Visit (HOSPITAL_COMMUNITY): Payer: Self-pay

## 2020-10-07 ENCOUNTER — Ambulatory Visit: Payer: 59 | Attending: Internal Medicine

## 2020-10-07 ENCOUNTER — Other Ambulatory Visit (HOSPITAL_BASED_OUTPATIENT_CLINIC_OR_DEPARTMENT_OTHER): Payer: Self-pay

## 2020-10-07 ENCOUNTER — Other Ambulatory Visit: Payer: Self-pay

## 2020-10-07 DIAGNOSIS — Z23 Encounter for immunization: Secondary | ICD-10-CM

## 2020-10-07 DIAGNOSIS — H52223 Regular astigmatism, bilateral: Secondary | ICD-10-CM | POA: Diagnosis not present

## 2020-10-07 MED ORDER — PFIZER-BIONT COVID-19 VAC-TRIS 30 MCG/0.3ML IM SUSP
INTRAMUSCULAR | 0 refills | Status: DC
  Filled 2020-10-07: qty 0.3, 1d supply, fill #0

## 2020-10-07 NOTE — Progress Notes (Signed)
   Covid-19 Vaccination Clinic  Name:  [name] Herman    MRN: [id number] DOB: 01-01-1961  10/07/2020  [name] was observed post Covid-19 immunization for 15 minutes without incident. He was provided with Vaccine Information Sheet and instruction to access the V-Safe system.   [name] was instructed to call 911 with any severe reactions post vaccine: Marland Kitchen Difficulty breathing  . Swelling of face and throat  . A fast heartbeat  . A bad rash all over body  . Dizziness and weakness   Immunizations Administered    Name Date Dose VIS Date Route   PFIZER Comrnaty(Gray TOP) Covid-19 Vaccine 10/07/2020  3:46 PM 0.3 mL 04/25/2020 Intramuscular   Manufacturer: [employer]   Lot: [id number]   NDC: [id number]

## 2020-10-09 ENCOUNTER — Other Ambulatory Visit (HOSPITAL_COMMUNITY): Payer: Self-pay

## 2020-10-09 MED ORDER — CARESTART COVID-19 HOME TEST VI KIT
PACK | 0 refills | Status: DC
  Filled 2020-10-09: qty 4, 4d supply, fill #0

## 2020-11-25 ENCOUNTER — Other Ambulatory Visit (HOSPITAL_COMMUNITY): Payer: Self-pay

## 2020-11-25 MED FILL — Pantoprazole Sodium EC Tab 40 MG (Base Equiv): ORAL | 90 days supply | Qty: 90 | Fill #1 | Status: AC

## 2020-12-02 ENCOUNTER — Other Ambulatory Visit (HOSPITAL_COMMUNITY): Payer: Self-pay

## 2020-12-20 ENCOUNTER — Encounter: Payer: 59 | Admitting: Family Medicine

## 2020-12-24 ENCOUNTER — Other Ambulatory Visit (HOSPITAL_COMMUNITY): Payer: Self-pay

## 2020-12-24 MED ORDER — CHLORHEXIDINE GLUCONATE 0.12 % MT SOLN
OROMUCOSAL | 0 refills | Status: DC
  Filled 2020-12-24: qty 473, 30d supply, fill #0

## 2020-12-24 MED ORDER — DEBACTEROL 30-50 % MT SOLN
OROMUCOSAL | 0 refills | Status: DC
  Filled 2020-12-26: qty 12, 12d supply, fill #0

## 2020-12-25 ENCOUNTER — Other Ambulatory Visit (HOSPITAL_COMMUNITY): Payer: Self-pay

## 2020-12-26 ENCOUNTER — Other Ambulatory Visit (HOSPITAL_COMMUNITY): Payer: Self-pay

## 2021-01-17 ENCOUNTER — Other Ambulatory Visit (HOSPITAL_COMMUNITY): Payer: Self-pay

## 2021-01-29 ENCOUNTER — Other Ambulatory Visit (HOSPITAL_COMMUNITY): Payer: Self-pay

## 2021-01-31 ENCOUNTER — Other Ambulatory Visit (HOSPITAL_COMMUNITY): Payer: Self-pay

## 2021-01-31 MED ORDER — CARESTART COVID-19 HOME TEST VI KIT
PACK | 0 refills | Status: DC
  Filled 2021-01-31: qty 4, 4d supply, fill #0

## 2021-02-04 ENCOUNTER — Other Ambulatory Visit (HOSPITAL_COMMUNITY): Payer: Self-pay

## 2021-03-11 DIAGNOSIS — H9190 Unspecified hearing loss, unspecified ear: Secondary | ICD-10-CM | POA: Diagnosis not present

## 2021-03-11 DIAGNOSIS — K219 Gastro-esophageal reflux disease without esophagitis: Secondary | ICD-10-CM | POA: Diagnosis not present

## 2021-03-11 DIAGNOSIS — I2510 Atherosclerotic heart disease of native coronary artery without angina pectoris: Secondary | ICD-10-CM | POA: Diagnosis not present

## 2021-03-19 ENCOUNTER — Other Ambulatory Visit (HOSPITAL_COMMUNITY): Payer: Self-pay

## 2021-03-20 ENCOUNTER — Other Ambulatory Visit (HOSPITAL_COMMUNITY): Payer: Self-pay

## 2021-03-20 MED ORDER — CHLORHEXIDINE GLUCONATE 0.12 % MT SOLN
OROMUCOSAL | 0 refills | Status: DC
  Filled 2021-03-20: qty 473, 15d supply, fill #0

## 2021-04-15 ENCOUNTER — Encounter: Payer: Self-pay | Admitting: Family Medicine

## 2021-04-15 ENCOUNTER — Ambulatory Visit: Payer: 59 | Admitting: Family Medicine

## 2021-04-15 ENCOUNTER — Ambulatory Visit: Payer: Self-pay

## 2021-04-15 VITALS — BP 132/92 | Ht 72.0 in | Wt 203.0 lb

## 2021-04-15 DIAGNOSIS — M25511 Pain in right shoulder: Secondary | ICD-10-CM | POA: Diagnosis not present

## 2021-04-15 MED ORDER — METHYLPREDNISOLONE ACETATE 40 MG/ML IJ SUSP
40.0000 mg | Freq: Once | INTRAMUSCULAR | Status: AC
  Administered 2021-04-15: 40 mg via INTRA_ARTICULAR

## 2021-04-15 NOTE — Progress Notes (Signed)
Glynn Attending Note: I have seen and examined this patient. I have discussed this patient with the resident and reviewed the assessment and plan as documented above. I agree with the resident's findings and plan.  [place] of right AC joint shows significant arthropathy with small effusion. PROCEDURE: INJECTION: Patient was given informed consent, signed copy in the chart. Appropriate time out was taken. Area prepped and draped in usual sterile fashion. Ethyl chloride was  used for local anesthesia. A 21 gauge 1 1/2 inch needle was used.. 1 cc of methylprednisolone 40 mg/ml plus  1 cc of 1% lidocaine without epinephrine was injected into the right AC joint using a(n) ultrasound guided approach.   The patient tolerated the procedure well. There were no complications. Post procedure instructions were given.

## 2021-04-15 NOTE — Progress Notes (Signed)
    SUBJECTIVE:   CHIEF COMPLAINT / HPI:   Right shoulder pain: 60 year old male presenting for right shoulder pain.  He has a history of "irritation" to his [facility] joint on the right side.  He previously was lifting weights more heavy than usual including bench press, dips, and military press and noted more pain with his AC joint.  He states it often resolves with rest but this time continues to be an aggravation.  He denies any concern of any tear or injury other than "aggravation".  Denies pain radiating anywhere.  Denies any pain with general shoulder movement.  OBJECTIVE:   BP (!) 132/92   Ht 6' (1.829 m)   Wt 203 lb (92.1 kg)   BMI 27.53 kg/m    General: NAD, pleasant, able to participate in exam MSK: Shoulder, right: No evidence of bony deformity, asymmetry, or muscle atrophy; No tenderness over long head of biceps (bicipital groove).  Positive TTP at [facility] joint. Full active and passive range of motion (180 flex [place] /150Abd /90ER /70IR), Thumb to T12 without significant tenderness. Strength 5/5 throughout. No abnormal scapular function observed. Sensation intact. Peripheral pulses intact.  Negative Yergason's test, empty can test, drop arm test.  Positive cross arm AC testing on the right      ASSESSMENT/PLAN:    Right AC joint pain 60 year old male with right AC joint pain.  Special testing shows some pain with palpation of the [facility] joint and positive crossarm AC joint testing on the right.  No concern for other shoulder pathology at this time based off of physical exam.  Ultrasound showed some fluid in the [facility] joint.  Patient did request a steroid injection of his right AC joint which was performed today, see above procedure note.  Recommended rest and easing back into exercises and follow-up if the pain does not continue to improve.  Discussed that he can use some ice and anti-inflammatories over the next few days as sometimes the steroid injection causes more pain initially before  improving the area.  Follow-up as needed.  Lurline Del, [place]

## 2021-04-15 NOTE — Patient Instructions (Signed)
Today we gave you a steroid injection for your [facility] joint.  It may be a bit more inflamed tomorrow because of the extra fluid in the joint space but should improve over the next few days.  If the pain does not improve or worsens over the next 1 to 2 weeks I would like for you to follow back up with [place] otherwise you can follow-up as needed.  As we discussed I recommend that you ease back in 2 upper body lifting exercises after giving it a bit of time to "cool down".  You can use pain as your guide with time to return to various exercises.

## 2021-05-23 ENCOUNTER — Other Ambulatory Visit (HOSPITAL_COMMUNITY): Payer: Self-pay

## 2021-05-23 ENCOUNTER — Telehealth (INDEPENDENT_AMBULATORY_CARE_PROVIDER_SITE_OTHER): Payer: 59 | Admitting: Family

## 2021-05-23 ENCOUNTER — Encounter: Payer: Self-pay | Admitting: Family

## 2021-05-23 VITALS — Ht 72.0 in | Wt 203.0 lb

## 2021-05-23 DIAGNOSIS — J069 Acute upper respiratory infection, unspecified: Secondary | ICD-10-CM

## 2021-05-23 MED ORDER — PREDNISONE 20 MG PO TABS
ORAL_TABLET | ORAL | 0 refills | Status: DC
  Filled 2021-05-23: qty 8, 5d supply, fill #0

## 2021-05-23 MED ORDER — METHYLPREDNISOLONE ACETATE 80 MG/ML IJ SUSP
80.0000 mg | Freq: Once | INTRAMUSCULAR | Status: AC
  Administered 2021-05-23: 80 mg via INTRAMUSCULAR

## 2021-05-23 MED ORDER — GUAIFENESIN-CODEINE 100-10 MG/5ML PO SYRP
10.0000 mL | ORAL_SOLUTION | Freq: Every evening | ORAL | 0 refills | Status: DC | PRN
  Filled 2021-05-23: qty 118, 11d supply, fill #0

## 2021-05-23 NOTE — Progress Notes (Signed)
MyChart Video Visit    Virtual Visit via Video Note   This visit type was conducted due to national recommendations for restrictions regarding the COVID-19 Pandemic (e.g. social distancing) in an effort to limit this patient's exposure and mitigate transmission in our community. This patient is at least at moderate risk for complications without adequate follow up. This format is felt to be most appropriate for this patient at this time. Physical exam was limited by quality of the video and audio technology used for the visit. CMA was able to get the patient set up on a video visit.  Patient location: Home. Patient and provider in visit Provider location: Office  I discussed the limitations of evaluation and management by telemedicine and the availability of in person appointments. The patient expressed understanding and agreed to proceed.  Visit Date: 05/23/2021  Today's healthcare provider: Jeanie Sewer, NP     Subjective:    Patient ID: [name] Herman, male    DOB: 26-Feb-1961, 61 y.o.   MRN: [id number]  Chief Complaint  Patient presents with   Hoarse    Started Monday. Taking OTC Nyquil and hot tea.   Cough   Nasal Congestion    HPI Upper Respiratory Infection: Symptoms include congestion, nasal congestion, non productive cough, and hoarseness .  Onset of symptoms was 5 days ago, unchanged since that time. He is drinking moderate amounts of fluids. Evaluation to date: none.  Treatment to date: cough suppressants and decongestants.    Past Medical History:  Diagnosis Date   Asymptomatic LV dysfunction    Echocardiogram 07/08/10: EF 40-45% with normal wall motion; question athlete's heart   Atypical chest pain    Cardiac catheterization 07/07/10: Normal coronary arteries; EF 65%;                             GI workup inconclusive with a normal abdominal ultrasound and normal HIDA scan   Cancer ([place])    skin-basel cell nose   Hx of adenomatous polyp of colon  08/30/2014   Myocardial infarction ([name]) 07/06/2010   normal cors     Past Surgical History:  Procedure Laterality Date   CARDIAC CATHETERIZATION  2012   COLONOSCOPY  2016    Outpatient Medications Prior to Visit  Medication Sig Dispense Refill   chlorhexidine (PERIDEX) 0.12 % solution Swish and spit with 1/2 ounce after breakfast and before bed. 473 mL 0   ibuprofen (ADVIL,MOTRIN) 200 MG tablet Take 800 mg by mouth daily as needed (pain).     metoprolol tartrate (LOPRESSOR) 100 MG tablet Take one tablet by mouth 2 hours prior to cardiac CT if heart rate is greater than 55 beats per minute 1 tablet 0   pantoprazole (PROTONIX) 40 MG tablet TAKE 1 TABLET BY MOUTH ONCE DAILY 90 tablet 3   Sulfuric Acid-Sulf Phenolics (DEBACTEROL) 30-13 % SOLN Immediately before applying throughly dry the ulcerated area and then apply with q-tip. If ulcers continue for longer than 7 days contact office and immediately follow up 12 each 0   COVID-19 mRNA Vac-TriS, Pfizer, (PFIZER-BIONT COVID-19 VAC-TRIS) SUSP injection Inject into the muscle. 0.3 mL 0   COVID-19 At Home Antigen Test (CARESTART COVID-19 HOME TEST) KIT Use as directed within package instructions (Patient not taking: Reported on 05/23/2021) 4 each 0   COVID-19 At Home Antigen Test (CARESTART COVID-19 HOME TEST) KIT Use as directed (Patient not taking: Reported on 05/23/2021) 4 each 0  No facility-administered medications prior to visit.    No Known Allergies      Objective:     Physical Exam Vitals and nursing note reviewed.  Constitutional:      General: She is not in acute distress.    Appearance: Normal appearance.  HENT:     Head: Normocephalic.  Pulmonary:     Effort: No respiratory distress.  Musculoskeletal:     Cervical back: Normal range of motion.  Skin:    General: Skin is dry.     Coloration: Skin is not pale.  Neurological:     Mental Status: She is alert and oriented to person, place, and time.  Psychiatric:         Mood and Affect: Mood normal.   Ht 6' (1.829 m)    Wt 203 lb 0.7 oz (92.1 kg)    BMI 27.54 kg/m   Wt Readings from Last 3 Encounters:  05/23/21 203 lb 0.7 oz (92.1 kg)  04/15/21 203 lb (92.1 kg)  12/20/19 205 lb 14.4 oz (93.4 kg)       Assessment & Plan:   Problem List Items Addressed This Visit       Respiratory   Viral upper respiratory tract infection - Primary    covid neg, very hoarse, has speaking engagement, advised on fluids, sending pred (starting tomorrow), cough syrup, pt will come by office for depo injection.      Relevant Medications   predniSONE (DELTASONE) 20 MG tablet   guaiFENesin-codeine (ROBITUSSIN AC) 100-10 MG/5ML syrup     I discussed the assessment and treatment plan with the patient. The patient was provided an opportunity to ask questions and all were answered. The patient agreed with the plan and demonstrated an understanding of the instructions.   The patient was advised to call back or seek an in-person evaluation if the symptoms worsen or if the condition fails to improve as anticipated.  I provided 22 minutes of face-to-face time during this encounter.   Jeanie Sewer, NP [place] [phone] (phone) [phone] (fax)  [place]

## 2021-05-23 NOTE — Assessment & Plan Note (Signed)
covid neg, very hoarse, has speaking engagement, advised on fluids, sending pred (starting tomorrow), cough syrup, pt will come by office for depo injection.

## 2021-05-24 ENCOUNTER — Telehealth: Payer: 59 | Admitting: Emergency Medicine

## 2021-05-24 ENCOUNTER — Other Ambulatory Visit (HOSPITAL_COMMUNITY): Payer: Self-pay

## 2021-05-24 DIAGNOSIS — J019 Acute sinusitis, unspecified: Secondary | ICD-10-CM | POA: Diagnosis not present

## 2021-05-24 MED ORDER — DOXYCYCLINE HYCLATE 100 MG PO TABS
100.0000 mg | ORAL_TABLET | Freq: Two times a day (BID) | ORAL | 0 refills | Status: AC
  Filled 2021-05-24: qty 20, 10d supply, fill #0

## 2021-05-24 NOTE — Patient Instructions (Signed)
[name] Herman, thank you for joining [name], PA-C for today's virtual visit.  While this provider is not your primary care provider (PCP), if your PCP is located in our provider database this encounter information will be shared with them immediately following your visit.  Consent: (Patient) [name] Herman provided verbal consent for this virtual visit at the beginning of the encounter.  Current Medications:  Current Outpatient Medications:    doxycycline (VIBRA-TABS) 100 MG tablet, Take 1 tablet (100 mg total) by mouth 2 (two) times daily for 10 days., Disp: 20 tablet, Rfl: 0   chlorhexidine (PERIDEX) 0.12 % solution, Swish and spit with 1/2 ounce after breakfast and before bed., Disp: 473 mL, Rfl: 0   COVID-19 mRNA Vac-TriS, Pfizer, (PFIZER-BIONT COVID-19 VAC-TRIS) SUSP injection, Inject into the muscle., Disp: 0.3 mL, Rfl: 0   guaiFENesin-codeine (ROBITUSSIN AC) 100-10 MG/5ML syrup, Take 10 mLs by mouth at bedtime as needed for cough or congestion., Disp: 118 mL, Rfl: 0   ibuprofen (ADVIL,MOTRIN) 200 MG tablet, Take 800 mg by mouth daily as needed (pain)., Disp: , Rfl:    metoprolol tartrate (LOPRESSOR) 100 MG tablet, Take one tablet by mouth 2 hours prior to cardiac CT if heart rate is greater than 55 beats per minute, Disp: 1 tablet, Rfl: 0   pantoprazole (PROTONIX) 40 MG tablet, TAKE 1 TABLET BY MOUTH ONCE DAILY, Disp: 90 tablet, Rfl: 3   predniSONE (DELTASONE) 20 MG tablet, START Saturday 05/24/2021. Take 2 tablets by mouth in the morning with breakfast for 3 days, then 1 tablet for 2 days, Disp: 8 tablet, Rfl: 0   Sulfuric Acid-Sulf Phenolics (DEBACTEROL) 54-00 % SOLN, Immediately before applying throughly dry the ulcerated area and then apply with q-tip. If ulcers continue for longer than 7 days contact office and immediately follow up, Disp: 12 each, Rfl: 0   Medications ordered in this encounter:  Meds ordered this encounter  Medications   doxycycline (VIBRA-TABS) 100 MG  tablet    Sig: Take 1 tablet (100 mg total) by mouth 2 (two) times daily for 10 days.    Dispense:  20 tablet    Refill:  0    Order Specific Question:   Supervising Provider    Answer:   Sabra Heck, BRIAN [[id number]]     *If you need refills on other medications prior to your next appointment, please contact your pharmacy*  Follow-Up: Call back or seek an in-person evaluation if the symptoms worsen or if the condition fails to improve as anticipated.  Other Instructions Rest and push fluids Doxycycline prescribed.  Take as directed and to completion Follow up with PCP as needed Follow up in person at urgent care or the ED if you have any new or worsening symptoms such as fever, worsening sinus pain/ pressure, cough, chest pain, shortness of breath, drooling, difficulty swallowing, symptoms are not improving over the course of 1-2 days, etc...   If you have been instructed to have an in-person evaluation today at a local Urgent Care facility, please use the link below. It will take you to a list of all of our available [place] [facility], including address, phone number and hours of operation. Please do not delay care.  [place] [facility]  If you or a family member do not have a primary care provider, use the link below to schedule a visit and establish care. When you choose a  primary care physician or advanced practice provider, you gain a long-term partner  in health. Find a Primary Care Provider  Learn more about [place]'s in-office and virtual care options: [place] [facility]

## 2021-05-24 NOTE — Progress Notes (Signed)
Virtual Visit Consent   [name] Herman, you are scheduled for a virtual visit with a [facility] provider today.     Just as with appointments in the office, your consent must be obtained to participate.  Your consent will be active for this visit and any virtual visit you may have with one of our providers in the next 365 days.     If you have a MyChart account, a copy of this consent can be sent to you electronically.  All virtual visits are billed to your insurance company just like a traditional visit in the office.    As this is a virtual visit, video technology does not allow for your provider to perform a traditional examination.  This may limit your provider's ability to fully assess your condition.  If your provider identifies any concerns that need to be evaluated in person or the need to arrange testing (such as labs, EKG, etc.), we will make arrangements to do so.     Although advances in technology are sophisticated, we cannot ensure that it will always work on either your end or our end.  If the connection with a video visit is poor, the visit may have to be switched to a telephone visit.  With either a video or telephone visit, we are not always able to ensure that we have a secure connection.     I need to obtain your verbal consent now.   Are you willing to proceed with your visit today? Yes   [name] Herman has provided verbal consent on 05/24/2021 for a virtual visit (video or telephone).   [name] Herman, [place]   Date: 05/24/2021 11:14 AM   Virtual Visit via Video Note   I, [name] Herman, connected with  [name] Herman  ([id number], [birth year]) on 05/24/21 at 11:15 AM EST by a video-enabled telemedicine application and verified that I am speaking with the correct person using two identifiers.  Location: Patient: Virtual Visit Location Patient: Home Provider: Virtual Visit Location Provider: Home Office   I discussed the limitations of evaluation and management by  telemedicine and the availability of in person appointments. The patient expressed understanding and agreed to proceed.    History of Present Illness: [name] Herman is a 61 y.o. who identifies as a male who was assigned male at birth, and is being seen today for sinus congestion and pressure x 4-5 days.  Denies sick exposure, states he was on an airplane however.  Had video visit yesterday and had prednisone, steroid shot and cough medication prescribed.  Reports some relief.  Reports previous symptoms in the past with sinus infection.  Denies fever, sore throat, wheezing, CP, SOB, nausea, vomiting, or diarrhea.    HPI: HPI  Problems:  Patient Active Problem List   Diagnosis Date Noted   Viral upper respiratory tract infection 05/23/2021   Tachycardia 09/11/2019   Shortness of breath 09/11/2019   GERD (gastroesophageal reflux disease) 09/20/2018   Right leg pain 01/21/2016   Psoas muscle strain 09/25/2014   Hx of adenomatous polyp of colon 08/30/2014   Peroneal tendonitis 08/27/2011   Chest pain    Mobitz (type) I [facility]) atrioventricular block    Asymptomatic LV dysfunction    ATRIOVENTRICULAR BLOCK, MOBITZ TYPE I 07/22/2010   CHEST PAIN 07/22/2010    Allergies: No Known Allergies Medications:  Current Outpatient Medications:    doxycycline (VIBRA-TABS) 100 MG tablet, Take 1 tablet (100 mg total) by mouth 2 (two) times daily  for 10 days., Disp: 20 tablet, Rfl: 0   chlorhexidine (PERIDEX) 0.12 % solution, Swish and spit with 1/2 ounce after breakfast and before bed., Disp: 473 mL, Rfl: 0   COVID-19 mRNA Vac-TriS, Pfizer, (PFIZER-BIONT COVID-19 VAC-TRIS) SUSP injection, Inject into the muscle., Disp: 0.3 mL, Rfl: 0   guaiFENesin-codeine (ROBITUSSIN AC) 100-10 MG/5ML syrup, Take 10 mLs by mouth at bedtime as needed for cough or congestion., Disp: 118 mL, Rfl: 0   ibuprofen (ADVIL,MOTRIN) 200 MG tablet, Take 800 mg by mouth daily as needed (pain)., Disp: , Rfl:    metoprolol  tartrate (LOPRESSOR) 100 MG tablet, Take one tablet by mouth 2 hours prior to cardiac CT if heart rate is greater than 55 beats per minute, Disp: 1 tablet, Rfl: 0   pantoprazole (PROTONIX) 40 MG tablet, TAKE 1 TABLET BY MOUTH ONCE DAILY, Disp: 90 tablet, Rfl: 3   predniSONE (DELTASONE) 20 MG tablet, START Saturday 05/24/2021. Take 2 tablets by mouth in the morning with breakfast for 3 days, then 1 tablet for 2 days, Disp: 8 tablet, Rfl: 0   Sulfuric Acid-Sulf Phenolics (DEBACTEROL) 44-01 % SOLN, Immediately before applying throughly dry the ulcerated area and then apply with q-tip. If ulcers continue for longer than 7 days contact office and immediately follow up, Disp: 12 each, Rfl: 0  Observations/Objective: Patient is well-developed, well-nourished in no acute distress.  Resting comfortably at home. Appears mildly fatigued, but nontoxic Head is normocephalic, atraumatic.  No labored breathing. Voice is hoarse, speaking in full sentences and tolerating own secretions Speech is clear and coherent with logical content.  Patient is alert and oriented at baseline.   Assessment and Plan: 1. Acute non-recurrent sinusitis, unspecified location  Rest and push fluids Doxycycline prescribed.  Take as directed and to completion Follow up with PCP as needed Follow up in person at urgent care or the ED if you have any new or worsening symptoms such as fever, worsening sinus pain/ pressure, cough, chest pain, shortness of breath, drooling, difficulty swallowing, symptoms are not improving over the course of 1-2 days, etc...  Follow Up Instructions: I discussed the assessment and treatment plan with the patient. The patient was provided an opportunity to ask questions and all were answered. The patient agreed with the plan and demonstrated an understanding of the instructions.  A copy of instructions were sent to the patient via MyChart unless otherwise noted below.    The patient was advised to call back  or seek an in-person evaluation if the symptoms worsen or if the condition fails to improve as anticipated.  Time:  I spent 10 minutes with the patient via telehealth technology discussing the above problems/concerns.    [name] Box, PA-C

## 2021-05-25 ENCOUNTER — Telehealth: Payer: 59 | Admitting: Nurse Practitioner

## 2021-05-27 ENCOUNTER — Other Ambulatory Visit: Payer: Self-pay | Admitting: Family Medicine

## 2021-05-27 ENCOUNTER — Other Ambulatory Visit (HOSPITAL_COMMUNITY): Payer: Self-pay

## 2021-05-27 MED ORDER — PANTOPRAZOLE SODIUM 40 MG PO TBEC
DELAYED_RELEASE_TABLET | Freq: Every day | ORAL | 3 refills | Status: DC
  Filled 2021-05-27: qty 90, 90d supply, fill #0

## 2021-06-27 ENCOUNTER — Other Ambulatory Visit (HOSPITAL_COMMUNITY): Payer: Self-pay

## 2021-08-01 ENCOUNTER — Telehealth: Payer: Self-pay | Admitting: Family Medicine

## 2021-08-01 NOTE — Telephone Encounter (Signed)
Lvm to schedule cpe  ?

## 2021-09-04 ENCOUNTER — Ambulatory Visit (INDEPENDENT_AMBULATORY_CARE_PROVIDER_SITE_OTHER): Payer: 59 | Admitting: Sports Medicine

## 2021-09-04 ENCOUNTER — Encounter: Payer: Self-pay | Admitting: Sports Medicine

## 2021-09-04 VITALS — BP 124/82 | Ht 72.0 in | Wt 201.0 lb

## 2021-09-04 DIAGNOSIS — M25561 Pain in right knee: Secondary | ICD-10-CM

## 2021-09-04 MED ORDER — METHYLPREDNISOLONE ACETATE 40 MG/ML IJ SUSP
40.0000 mg | Freq: Once | INTRAMUSCULAR | Status: AC
  Administered 2021-09-04: 40 mg via INTRA_ARTICULAR

## 2021-09-04 NOTE — Progress Notes (Signed)
? ?  Subjective:  ? ? Patient ID: [name] Herman, male    DOB: 1960-08-17, 61 y.o.   MRN: [id number] ? ?HPI chief complaint: Right knee pain ? ?[name] Herman presents today with approximately 2 weeks of right knee pain.  Pain began after a long drive to [place], [place] on [place] weekend.  He began to experience medial sided knee pain on the drive back.  Since then he has had persistent pain and stiffness which makes it difficult for him to walk by the end of the day.  He has not noticed any swelling.  He denies problems with this knee in the past.  No mechanical symptoms.  No prior knee surgeries.  Pain does not radiate.  He has tried ice, Motrin, and Voltaren gel with minimal pain relief.  He also states that the knee will get stiff. ? ?Interim medical history reviewed ?Medications reviewed ?Allergies reviewed ? ? ?Review of Systems ?As above ?   ?Objective:  ? Physical Exam ? ?Well-developed, fit appearing.  No acute distress. ? ?Right knee: Full range of motion.  No effusion.  He is tender to palpation along the medial joint line but a negative McMurray's.  Negative Thessaly's.  Knee is stable to valgus and varus stressing.  Negative anterior drawer, negative posterior drawer.  No tenderness over the Pes anserine bursa.  Neurovascular intact distally. ? ?Limited MSK ultrasound of the right knee shows no evidence of suprapatellar effusion.  Visualized medial meniscus is unremarkable. ? ? ?   ?Assessment & Plan:  ? ?Right knee pain and stiffness likely secondary to mild DJD ? ?I discussed treatment with a scheduled oral anti-inflammatory versus cortisone injection.  [name] would like to proceed with a cortisone injection.  This was accomplished atraumatically under sterile technique utilizing an anterior medial approach.  He tolerates this without difficulty.  Continue with ice as needed.  If symptoms persist then we will get further imaging initially in the form of an x-ray.  Follow-up for ongoing or recalcitrant  issues. ? ?Consent obtained and verified. ?Time-out conducted. ?Noted no overlying erythema, induration, or other signs of local infection. ?Skin prepped in a sterile fashion. ?Topical analgesic spray: Ethyl chloride. ?Joint: Right knee ?Needle: 25-gauge 1.5 inch ?Completed without difficulty. ?Meds: 3 cc 1% Xylocaine, 1 cc (40 mg) Depo-Medrol ? ?Advised to call if fevers/chills, erythema, induration, drainage, or persistent bleeding.  ?

## 2022-09-14 ENCOUNTER — Other Ambulatory Visit (HOSPITAL_COMMUNITY): Payer: Self-pay

## 2022-09-14 MED ORDER — DEBACTEROL 30-50 % MT SOLN
OROMUCOSAL | 0 refills | Status: DC
  Filled 2022-09-14: qty 12, 12d supply, fill #0

## 2022-09-15 ENCOUNTER — Other Ambulatory Visit (HOSPITAL_COMMUNITY): Payer: Self-pay

## 2022-09-16 ENCOUNTER — Other Ambulatory Visit (HOSPITAL_COMMUNITY): Payer: Self-pay

## 2022-09-17 ENCOUNTER — Other Ambulatory Visit (HOSPITAL_COMMUNITY): Payer: Self-pay

## 2022-10-23 ENCOUNTER — Other Ambulatory Visit (HOSPITAL_COMMUNITY): Payer: Self-pay

## 2022-12-18 ENCOUNTER — Other Ambulatory Visit (HOSPITAL_COMMUNITY): Payer: Self-pay

## 2022-12-18 ENCOUNTER — Ambulatory Visit: Payer: 59 | Admitting: Sports Medicine

## 2022-12-18 ENCOUNTER — Other Ambulatory Visit: Payer: Self-pay

## 2022-12-18 VITALS — BP 136/82 | Ht 72.0 in | Wt 200.0 lb

## 2022-12-18 DIAGNOSIS — M25572 Pain in left ankle and joints of left foot: Secondary | ICD-10-CM

## 2022-12-18 MED ORDER — MELOXICAM 15 MG PO TABS
15.0000 mg | ORAL_TABLET | Freq: Every day | ORAL | 0 refills | Status: DC | PRN
  Filled 2022-12-18 (×2): qty 30, 30d supply, fill #0

## 2022-12-18 NOTE — Progress Notes (Signed)
   Subjective:    Patient ID: [name] Herman, male    DOB: Aug 27, 1960, 62 y.o.   MRN: [id number]  HPI chief complaint: Left foot pain  Eliezer presents today complaining of left plantar foot pain that began acutely about 3 weeks ago.  He had been at [employer] doing quite a bit of walking prior to his injury.  His injury occurred as he was taking a large step forward.  His left ankle inverted just slightly and he felt immediate pain along the plantar aspect of the medial foot.  He has been self treating with over-the-counter ibuprofen, night splint, and massage.  His symptoms are improving.  He has an upcoming trip next week to [place] in which she will be on safari.  This obviously involves a lot of walking.    Review of Systems As above    Objective:   Physical Exam  Well-developed, well-nourished.  No acute distress  Left foot: No ecchymosis or soft tissue swelling.  There is tenderness to palpation at the calcaneal origin of the plantar fascia.  Longitudinal arch is preserved.  No swelling on the dorsum of the foot.  Ultrasound of the left plantar fascia shows edema at the origin      Assessment & Plan:   Probable partial left plantar fascial tear  Irie's symptoms are improving.  He will continue with his dorsiflexion splinting and I encouraged him to ice using an ice bath.  We will also prescribe meloxicam 15 mg daily for 7 days then as needed.  We will also give him an arch strap to try.  If he finds it to be too painful or uncomfortable then he may discontinue that.  I also recommended that he wear good supportive and well cushioned shoes or boots.  Follow-up for ongoing or recalcitrant issues.  This note was dictated using Dragon naturally speaking software and may contain errors in syntax, spelling, or content which have not been identified prior to signing this note.

## 2022-12-30 ENCOUNTER — Other Ambulatory Visit: Payer: Self-pay

## 2023-09-20 ENCOUNTER — Telehealth (INDEPENDENT_AMBULATORY_CARE_PROVIDER_SITE_OTHER): Admitting: Family

## 2023-09-20 ENCOUNTER — Encounter: Payer: Self-pay | Admitting: Family

## 2023-09-20 VITALS — Ht 72.0 in

## 2023-09-20 DIAGNOSIS — R0681 Apnea, not elsewhere classified: Secondary | ICD-10-CM

## 2023-09-20 DIAGNOSIS — T63304A Toxic effect of unspecified spider venom, undetermined, initial encounter: Secondary | ICD-10-CM

## 2023-09-20 DIAGNOSIS — L03113 Cellulitis of right upper limb: Secondary | ICD-10-CM | POA: Diagnosis not present

## 2023-09-20 MED ORDER — DOXYCYCLINE HYCLATE 100 MG PO TABS: 100.0000 mg | ORAL_TABLET | Freq: Two times a day (BID) | ORAL | 0 refills | Status: DC

## 2023-09-20 NOTE — Progress Notes (Signed)
 MyChart Video Visit    Virtual Visit via Video Note   This format is felt to be most appropriate for this patient at this time. Physical exam was limited by quality of the video and audio technology used for the visit. CMA was able to get the patient set up on a video visit.  Patient location: Home. Patient and provider in visit Provider location: Office  I discussed the limitations of evaluation and management by telemedicine and the availability of in person appointments. The patient expressed understanding and agreed to proceed.  Visit Date: 09/20/2023  Today's healthcare provider: Versa Gore, NP     Subjective:   Patient ID: [name] Herman, male    DOB: 12/02/1960, 63 y.o.   MRN: [id number]  Chief Complaint  Patient presents with   Insect Bite    Pt c/o spider bite, present on Sunday. Pt c/o redness, itching and pain. Has tried neosporin.   Snoring    Pt c/o snoring at night. Wife stated pt stops breathing while sleeping. Pt would like to discuss a Sleep study  Discussed the use of AI scribe software for clinical note transcription with the patient, who gave verbal consent to proceed.  History of Present Illness [name] Herman is a 63 year old male who presents with a spreading skin lesion following a bite.  He noticed the bite on his arm yesterday morning, initially the size of a dime or nickel. By today, the area has doubled in size with spreading redness and warmth. Initially hard and tight, the area has since loosened slightly. It was warm to touch and had a white head that opened, after which he applied Neosporin. He experiences tingling and itchiness around the bite area. There is no radiating pain or observed streaks. The redness is consistent without a bullseye pattern, and a blister that burst was treated with Neosporin. He also is reporting that his wife has been on him for a long time to get evaluated for sleep apnea due to her witnessing him not  breathing during sleep and loud snoring. Denies excessive tiredness during day.  Assessment & Plan Insect bite with secondary infection - Insect bite on arm with secondary infection, erythema, and opened white head today, cleaned and applied neosporin, reports mild tingling itching, no radiating tingling, numbness or pain. No streaking. Differential includes spider versus tick bite. Doxycycline  prescribed for infection control. - Prescribed doxycycline  100mg  bid x 7d. - Advised to keep area clean and ok to apply a thin layer of Neosporin if lesion is open or draining. - Advised on worsening s/s and when to call back  Suspected sleep apnea - Suspected sleep apnea due to witnessed apneas, daytime fatigue, and family history. Referral for sleep study planned. - Refer for sleep study via Pulmonary office.   Past Medical History:  Diagnosis Date   Asymptomatic LV dysfunction    Echocardiogram 07/08/10: EF 40-45% with normal wall motion; question athlete's heart   Atypical chest pain    Cardiac catheterization 07/07/10: Normal coronary arteries; EF 65%;                             GI workup inconclusive with a normal abdominal ultrasound and normal HIDA scan   Cancer (HCC)    skin-basel cell nose   Hx of adenomatous polyp of colon 08/30/2014   Myocardial infarction (HCC) 07/06/2010   normal cors    Past Surgical History:  Procedure Laterality Date   CARDIAC CATHETERIZATION  2012   COLONOSCOPY  2016    Outpatient Medications Prior to Visit  Medication Sig Dispense Refill   chlorhexidine  (PERIDEX ) 0.12 % solution Swish and spit with 1/2 ounce after breakfast and before bed. (Patient not taking: Reported on 09/20/2023) 473 mL 0   COVID-19 mRNA Vac-TriS, Pfizer, (PFIZER-BIONT COVID-19 VAC-TRIS) SUSP injection Inject into the muscle. (Patient not taking: Reported on 09/20/2023) 0.3 mL 0   guaiFENesin -codeine  (ROBITUSSIN AC) 100-10 MG/5ML syrup Take 10 mLs by mouth at bedtime as needed for cough  or congestion. (Patient not taking: Reported on 09/20/2023) 118 mL 0   ibuprofen (ADVIL,MOTRIN) 200 MG tablet Take 800 mg by mouth daily as needed (pain). (Patient not taking: Reported on 09/20/2023)     meloxicam  (MOBIC ) 15 MG tablet Take 1 tablet (15 mg total) by mouth daily with food for 7 days, then daily as needed. (Patient not taking: Reported on 09/20/2023) 40 tablet 0   pantoprazole  (PROTONIX ) 40 MG tablet TAKE 1 TABLET BY MOUTH ONCE DAILY (Patient not taking: Reported on 09/20/2023) 90 tablet 3   predniSONE  (DELTASONE ) 20 MG tablet START Saturday 05/24/2021. Take 2 tablets by mouth in the morning with breakfast for 3 days, then 1 tablet for 2 days (Patient not taking: Reported on 09/20/2023) 8 tablet 0   Sulfuric Acid -Sulf Phenolics (DEBACTEROL) 30-50 % SOLN Immediately before applying throughly dry the ulcerated area and then apply with q-tip. If ulcers continue for longer than 7 days contact office and immediately follow up (Patient not taking: Reported on 09/20/2023) 12 each 0   Sulfuric Acid -Sulf Phenolics (DEBACTEROL) 30-50 % SOLN Dry the application site and apply with a cotton swab for 5 seconds. Rinse the mouth after application, no more than 1 application per site. (Patient not taking: Reported on 09/20/2023) 12 each 0   metoprolol  tartrate (LOPRESSOR ) 100 MG tablet Take one tablet by mouth 2 hours prior to cardiac CT if heart rate is greater than 55 beats per minute 1 tablet 0   No facility-administered medications prior to visit.    No Known Allergies     Objective:   Physical Exam Vitals and nursing note reviewed.  Constitutional:      General: Pt is not in acute distress.    Appearance: Normal appearance.  HENT:     Head: Normocephalic.  Pulmonary:     Effort: No respiratory distress.  Musculoskeletal:     Cervical back: Normal range of motion.  Skin:    General: Skin is dry.  Bite mark noted on ventral middle right arm with approx. 6-7cm in diameter pink erythema around bite. Pt  reports no induration with palpation.    Coloration: Skin is not pale.  Neurological:     Mental Status: Pt is alert and oriented to person, place, and time.  Psychiatric:        Mood and Affect: Mood normal.   Ht 6' (1.829 m)   BMI 27.12 kg/m   Wt Readings from Last 3 Encounters:  12/18/22 200 lb (90.7 kg)  09/04/21 201 lb (91.2 kg)  05/23/21 203 lb 0.7 oz (92.1 kg)      I discussed the assessment and treatment plan with the patient. The patient was provided an opportunity to ask questions and all were answered. The patient agreed with the plan and demonstrated an understanding of the instructions.   The patient was advised to call back or seek an in-person evaluation if the symptoms worsen or if  the condition fails to improve as anticipated.  Versa Gore, NP [facility] [phone] (phone) [phone] (fax)  [facility]

## 2023-10-28 ENCOUNTER — Encounter (HOSPITAL_BASED_OUTPATIENT_CLINIC_OR_DEPARTMENT_OTHER): Payer: Self-pay | Admitting: Adult Health

## 2023-10-28 ENCOUNTER — Ambulatory Visit (HOSPITAL_BASED_OUTPATIENT_CLINIC_OR_DEPARTMENT_OTHER): Admitting: Adult Health

## 2023-10-28 VITALS — BP 111/77 | HR 67 | Ht 72.0 in | Wt 205.0 lb

## 2023-10-28 DIAGNOSIS — R0683 Snoring: Secondary | ICD-10-CM

## 2023-10-28 DIAGNOSIS — Z87891 Personal history of nicotine dependence: Secondary | ICD-10-CM | POA: Diagnosis not present

## 2023-10-28 NOTE — Patient Instructions (Addendum)
 Set up for home sleep study  Do not drive if sleepy  Follow up in 6 weeks to discuss results and treatment plan

## 2023-10-28 NOTE — Progress Notes (Signed)

## 2023-10-28 NOTE — Progress Notes (Signed)
 @Patient  ID: [name] Herman, male    DOB: 1960/11/13, 63 y.o.   MRN: [id number]  Chief Complaint  Patient presents with   Establish Care    Sleep consult    Referring provider: Versa Gore, NP  HPI: 63 year old male seen for sleep consult October 28, 2023 for snoring, witnessed apneic events and daytime sleepiness Patient is a retired Teacher, early years/pre  TEST/EVENTS :   10/28/23 Sleep consult  Patient presents for a sleep consult.  Patient complains of loud snoring that has been going on for years.  His spouse complains of the loud snoring along with witnessed apneic events.  He has some daytime sleepiness and fatigue.  His father and brother both have sleep apnea.  Patient says he does feel tired with low energy.  He does not nap.  Has no removable dental work.  Does not have a history of congestive heart failure or stroke.  Typically gets in about 1 cup of caffeine daily.  Does not take any sleep aids.  Does have some history of some tachycardia.  Typically goes to bed about 10 to 11 PM.  Takes about 5 minutes to go to sleep.  Wakes up 2-4 times each night.  Gets up at 5:15 AM.  Weight is steady currently at 205 pounds with a BMI of 27.  Patient is active.  Epworth score is 5 out of 24.  Gets sleepy when he sits down to rest, watch TV and in the afternoon hours.  Patient is married.  Lives at home with his spouse.  Has adult children.  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  He is a retired Teacher, early years/pre  No Known Allergies  Immunization History  Administered Date(s) Administered   Hepatitis A, Adult 11/27/2015, 01/19/2018   Hepatitis B 07/17/2010   Influenza,inj,Quad PF,6+ Mos 01/27/2020   Influenza-Unspecified 02/15/2017, 02/15/2021   PFIZER Comirnaty(Gray Top)Covid-19 Tri-Sucrose Vaccine 10/07/2020   PFIZER(Purple Top)SARS-COV-2 Vaccination 05/08/2019, 06/08/2019   Tdap 06/18/2010    Past Medical History:  Diagnosis Date   Asymptomatic LV dysfunction     Echocardiogram 07/08/10: EF 40-45% with normal wall motion; question athlete's heart   Atypical chest pain    Cardiac catheterization 07/07/10: Normal coronary arteries; EF 65%;                             GI workup inconclusive with a normal abdominal ultrasound and normal HIDA scan   Cancer (HCC)    skin-basel cell nose   Hx of adenomatous polyp of colon 08/30/2014   Myocardial infarction (HCC) 07/06/2010   normal cors    Tobacco History: Social History   Tobacco Use  Smoking Status Former   Current packs/day: 0.00   Average packs/day: 0.5 packs/day for 3.0 years (1.5 ttl pk-yrs)   Types: Cigarettes   Start date: 07/30/1977   Quit date: 07/30/1980   Years since quitting: 43.2  Smokeless Tobacco Never   Counseling given: Not Answered   Outpatient Medications Prior to Visit  Medication Sig Dispense Refill   chlorhexidine  (PERIDEX ) 0.12 % solution Swish and spit with 1/2 ounce after breakfast and before bed. (Patient not taking: Reported on 10/28/2023) 473 mL 0   COVID-19 mRNA Vac-TriS, Pfizer, (PFIZER-BIONT COVID-19 VAC-TRIS) SUSP injection Inject into the muscle. (Patient not taking: Reported on 10/28/2023) 0.3 mL 0   doxycycline  (VIBRA -TABS) 100 MG tablet Take 1 tablet (100 mg total) by mouth 2 (two) times daily. (Patient not  taking: Reported on 10/28/2023) 14 tablet 0   guaiFENesin -codeine  (ROBITUSSIN AC) 100-10 MG/5ML syrup Take 10 mLs by mouth at bedtime as needed for cough or congestion. (Patient not taking: Reported on 10/28/2023) 118 mL 0   ibuprofen (ADVIL,MOTRIN) 200 MG tablet Take 800 mg by mouth daily as needed (pain). (Patient not taking: Reported on 10/28/2023)     meloxicam  (MOBIC ) 15 MG tablet Take 1 tablet (15 mg total) by mouth daily with food for 7 days, then daily as needed. (Patient not taking: Reported on 10/28/2023) 40 tablet 0   pantoprazole  (PROTONIX ) 40 MG tablet TAKE 1 TABLET BY MOUTH ONCE DAILY (Patient not taking: Reported on 10/28/2023) 90 tablet 3   predniSONE   (DELTASONE ) 20 MG tablet START Saturday 05/24/2021. Take 2 tablets by mouth in the morning with breakfast for 3 days, then 1 tablet for 2 days (Patient not taking: Reported on 10/28/2023) 8 tablet 0   Sulfuric Acid -Sulf Phenolics (DEBACTEROL) 30-50 % SOLN Immediately before applying throughly dry the ulcerated area and then apply with q-tip. If ulcers continue for longer than 7 days contact office and immediately follow up (Patient not taking: Reported on 10/28/2023) 12 each 0   Sulfuric Acid -Sulf Phenolics (DEBACTEROL) 30-50 % SOLN Dry the application site and apply with a cotton swab for 5 seconds. Rinse the mouth after application, no more than 1 application per site. (Patient not taking: Reported on 10/28/2023) 12 each 0   No facility-administered medications prior to visit.     Review of Systems:   Constitutional:   No  weight loss, night sweats,  Fevers, chills,+ fatigue, or  lassitude.  HEENT:   No headaches,  Difficulty swallowing,  Tooth/dental problems, or  Sore throat,                No sneezing, itching, ear ache, nasal congestion, post nasal drip,   CV:  No chest pain,  Orthopnea, PND, swelling in lower extremities, anasarca, dizziness, palpitations, syncope.   GI  No heartburn, indigestion, abdominal pain, nausea, vomiting, diarrhea, change in bowel habits, loss of appetite, bloody stools.   Resp: No shortness of breath with exertion or at rest.  No excess mucus, no productive cough,  No non-productive cough,  No coughing up of blood.  No change in color of mucus.  No wheezing.  No chest wall deformity  Skin: no rash or lesions.  GU: no dysuria, change in color of urine, no urgency or frequency.  No flank pain, no hematuria   MS:  No joint pain or swelling.  No decreased range of motion.  No back pain.    Physical Exam  BP 111/77   Pulse 67   Ht 6' (1.829 m)   Wt 205 lb (93 kg)   SpO2 97%   BMI 27.80 kg/m   GEN: A/Ox3; pleasant , NAD, well nourished    HEENT:  [place]/AT,   NOSE-clear, THROAT-clear, no lesions, no postnasal drip or exudate noted. Class 3 MP airway   NECK:  Supple w/ fair ROM; no JVD; normal carotid impulses w/o bruits; no thyromegaly or nodules palpated; no lymphadenopathy.    RESP  Clear  P & A; w/o, wheezes/ rales/ or rhonchi. no accessory muscle use, no dullness to percussion  CARD:  RRR, no m/r/g, no peripheral edema, pulses intact, no cyanosis or clubbing.  GI:   Soft & nt; nml bowel sounds; no organomegaly or masses detected.   Musco: Warm bil, no deformities or joint swelling noted.   Neuro:  alert, no focal deficits noted.    Skin: Warm, no lesions or rashes    Lab Results:  CBC   BNP No results found for: BNP  ProBNP   Imaging: No results found.  Administration History     None           No data to display          No results found for: NITRICOXIDE      Assessment & Plan:   No problem-specific Assessment & Plan notes found for this encounter.     Roena Clark, NP 10/28/2023

## 2023-10-29 DIAGNOSIS — R0683 Snoring: Secondary | ICD-10-CM | POA: Insufficient documentation

## 2023-10-29 NOTE — Assessment & Plan Note (Signed)
 Loud snoring, restless sleep, daytime fatigue, witnessed apneic events-all suspicious for underlying sleep apnea.  Will set patient up for home sleep study.  Patient education given on sleep apnea  - discussed how weight can impact sleep and risk for sleep disordered breathing - discussed options to assist with weight loss: combination of diet modification, cardiovascular and strength training exercises   - had an extensive discussion regarding the adverse health consequences related to untreated sleep disordered breathing - specifically discussed the risks for hypertension, coronary artery disease, cardiac dysrhythmias, cerebrovascular disease, and diabetes - lifestyle modification discussed   - discussed how sleep disruption can increase risk of accidents, particularly when driving - safe driving practices were discussed   Plan  Patient Instructions  Set up for home sleep study  Do not drive if sleepy  Follow up in 6 weeks to discuss results and treatment plan.

## 2023-11-09 ENCOUNTER — Encounter: Payer: Self-pay | Admitting: Family

## 2023-11-10 ENCOUNTER — Telehealth: Admitting: Physician Assistant

## 2023-11-10 ENCOUNTER — Telehealth: Payer: Self-pay | Admitting: Family Medicine

## 2023-11-10 DIAGNOSIS — T63301A Toxic effect of unspecified spider venom, accidental (unintentional), initial encounter: Secondary | ICD-10-CM

## 2023-11-10 DIAGNOSIS — L03119 Cellulitis of unspecified part of limb: Secondary | ICD-10-CM

## 2023-11-10 MED ORDER — CEPHALEXIN 500 MG PO CAPS: 500.0000 mg | ORAL_CAPSULE | Freq: Three times a day (TID) | ORAL | 0 refills | Status: AC

## 2023-11-10 MED ORDER — PREDNISONE 20 MG PO TABS: 40.0000 mg | ORAL_TABLET | Freq: Every day | ORAL | 0 refills | Status: DC

## 2023-11-10 NOTE — Telephone Encounter (Signed)
 Copied from CRM [id number]. Topic: Appointments - Scheduling Inquiry for Clinic >> Nov 10, 2023  9:40 AM Larissa RAMAN wrote: Reason for CRM: Patient requesting to continue care with Dr. Micheal. Last visit with Dr. Micheal was 12/20/2019.  Callback # [phone]

## 2023-11-10 NOTE — Telephone Encounter (Signed)
 can he schedule a virtual visit? or OV is ok also, thx

## 2023-11-10 NOTE — Progress Notes (Signed)
 E-Visit for Insect Sting  Thank you for describing the insect sting for us .  Here is how we plan to help!  Based on what you have shared with me you are dealing with spider bite and subsequent infection of the surrounding skin. I have prescribed a short course of prednisone  for swelling and itch, as well as an antibiotic (Keflex) to take as directed.  What can be used to prevent Insect Stings?  Insect repellant with at least 20% DEET.  Wearing long pants and shirts with socks and shoes.  Wear dark or drab-colored clothes rather than bright colors.  Avoid using perfumes and hair sprays; these attract insects.  HOME CARE ADVICE:  1. Stinger removal: The stinger looks like a tiny black dot in the sting. Use a fingernail, credit card edge, or knife-edge to scrape it off.  Don't pull it out because it squeezes out more venom. If the stinger is below the skin surface, leave it alone.  It will be shed with normal skin healing. 2. Use cold compresses to the area of the sting for 10-20 minutes.  You may repeat this as needed to relieve symptoms of pain and swelling. 3.  For pain relief, take acetominophen 650 mg 4-6 hours as needed or ibuprofen 400 mg every 6-8 hours as needed or naproxen 250-500 mg every 12 hours as needed. 4.  You can also use hydrocortisone cream 0.5% or 1% up to 4 times daily as needed for itching. 5.  If the sting becomes very itchy, take Benadryl 25-50 mg, follow directions on box. 6.  Wash the area 2-3 times daily with antibacterial soap and warm water. 7. Call your Doctor if: Fever, a severe headache, or rash occur in the next 2 weeks. Sting area begins to look infected. Redness and swelling worsens after home treatment. Your current symptoms become worse.    MAKE SURE YOU:  Understand these instructions. Will watch your condition. Will get help right away if you are not doing well or get worse.  Thank you for choosing an e-visit.  Your e-visit answers were  reviewed by a board certified advanced clinical practitioner to complete your personal care plan. Depending upon the condition, your plan could have included both over the counter or prescription medications.  Please review your pharmacy choice. Make sure the pharmacy is open so you can pick up prescription now. If there is a problem, you may contact your provider through [employer] and have the prescription routed to another pharmacy.  Your safety is important to us . If you have drug allergies check your prescription carefully.   For the next 24 hours you can use MyChart to ask questions about today's visit, request a non-urgent call back, or ask for a work or school excuse. You will get an email in the next two days asking about your experience. I hope that your e-visit has been valuable and will speed your recovery.

## 2023-11-10 NOTE — Progress Notes (Signed)
 I have spent 5 minutes in review of e-visit questionnaire, review and updating patient chart, medical decision making and response to patient.   Piedad Climes, PA-C

## 2023-11-11 NOTE — Telephone Encounter (Signed)
 Noted

## 2023-11-12 NOTE — Telephone Encounter (Signed)
 Pt will be sch for 11-23-2023 330 pm

## 2023-11-23 ENCOUNTER — Ambulatory Visit (INDEPENDENT_AMBULATORY_CARE_PROVIDER_SITE_OTHER): Admitting: Family Medicine

## 2023-11-23 ENCOUNTER — Encounter: Payer: Self-pay | Admitting: Family Medicine

## 2023-11-23 VITALS — BP 124/84 | HR 79 | Temp 98.2°F | Wt 216.5 lb

## 2023-11-23 DIAGNOSIS — W57XXXA Bitten or stung by nonvenomous insect and other nonvenomous arthropods, initial encounter: Secondary | ICD-10-CM | POA: Diagnosis not present

## 2023-11-23 DIAGNOSIS — S80869A Insect bite (nonvenomous), unspecified lower leg, initial encounter: Secondary | ICD-10-CM

## 2023-11-23 NOTE — Progress Notes (Signed)
 New Patient Office Visit  Subjective    Patient ID: [name] Herman, male    DOB: 1961-05-02  Age: 63 y.o. MRN: [id number]  CC:  Chief Complaint  Patient presents with   New Patient (Initial Visit)    HPI [name] Herman presents to establish care Leston is actually here to reestablish care.  Not seen here in over 3 years. Generally very healthy.  Takes no regular medications.  Has not had a physical in about 4 years.  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  Does a lot of hiking in the woods.  Has had several recent skin reactions that were presumed bites.  Had a couple of telehealth visits and was prescribed doxycycline  once and prednisone  and Keflex  another time.  Has had several small papules which are pruritic on his lower extremities which are resolving.  No recent fever.  Has had several tick bites this year.  No recent headaches or increased arthralgias.  Outpatient Encounter Medications as of 11/23/2023  Medication Sig   [DISCONTINUED] predniSONE  (DELTASONE ) 20 MG tablet Take 2 tablets (40 mg total) by mouth daily with breakfast.   No facility-administered encounter medications on file as of 11/23/2023.    Past Medical History:  Diagnosis Date   Asymptomatic LV dysfunction    Echocardiogram 07/08/10: EF 40-45% with normal wall motion; question athlete's heart   Atypical chest pain    Cardiac catheterization 07/07/10: Normal coronary arteries; EF 65%;                             GI workup inconclusive with a normal abdominal ultrasound and normal HIDA scan   Cancer (HCC)    skin-basel cell nose   Hx of adenomatous polyp of colon 08/30/2014   Myocardial infarction (HCC) 07/06/2010   normal cors    Past Surgical History:  Procedure Laterality Date   CARDIAC CATHETERIZATION  2012   COLONOSCOPY  2016    Family History  Problem  Relation Age of Onset   Hypertension Mother    Hyperlipidemia Mother    Asthma Father    Alcohol abuse Father    Hypertension Father    Hyperlipidemia Father    Stroke Father        2010   Heart disease Father 87       atrial fibrillation   Colon polyps Father    Hyperlipidemia Maternal Grandmother    Hyperlipidemia Maternal Grandfather    Diabetes Maternal Grandfather    Hyperlipidemia Paternal Grandmother    Cancer Paternal Grandfather        colon   Hyperlipidemia Paternal Grandfather    Colon cancer Paternal Grandfather    Esophageal cancer Neg Hx    Stomach cancer Neg Hx     Social History   Socioeconomic History   Marital status: Married    Spouse name: Not on file   Number of children: Not on file   Years of education: Not on file   Highest education level: Not on file  Occupational History   Not on file  Tobacco Use   Smoking status: Former    Current packs/day: 0.00    Average packs/day: 0.5 packs/day for 3.0 years (1.5 ttl pk-yrs)    Types: Cigarettes    Start date: 07/30/1977  Quit date: 07/30/1980    Years since quitting: 43.3   Smokeless tobacco: Never  Vaping Use   Vaping status: Never Used  Substance and Sexual Activity   Alcohol use: Yes    Alcohol/week: 0.0 standard drinks of alcohol    Comment: occ < weekly-social    Drug use: No   Sexual activity: Not on file  Other Topics Concern   Not on file  Social History Narrative   The patient is married.  He is a Teacher, early years/pre.  He has a     He has not smoked since high school.       Social Drivers of Corporate investment banker Strain: Not on file  Food Insecurity: Not on file  Transportation Needs: Not on file  Physical Activity: Not on file  Stress: Not on file  Social Connections: Not on file  Intimate Partner Violence: Not on file    Review of Systems  Constitutional:  Negative for chills and fever.  Neurological:  Negative for headaches.        Objective    BP 124/84 (BP  Location: Left Arm, Patient Position: Sitting, Cuff Size: Normal)   Pulse 79   Temp 98.2 F (36.8 C) (Oral)   Wt 216 lb 8 oz (98.2 kg)   SpO2 95%   BMI 29.36 kg/m   Physical Exam Vitals reviewed.  Constitutional:      General: He is not in acute distress.    Appearance: He is not ill-appearing.  Cardiovascular:     Rate and Rhythm: Normal rate and regular rhythm.  Skin:    Comments: Couple small approximately 2 mm erythematous papular areas on his lower extremities which are drying up.  No pustular center at this time.  Nontender.  Neurological:     Mental Status: He is alert.         Assessment & Plan:   Skin lesions recently lower extremities which likely represented local allergic reaction from some sort of insect bite.  Discussed signs and symptoms of tick related fever including Lyme disease and [place] spotted fever.  No need for any specific treatment at this time.  Recommend setting up complete physical within the next few months  Wolm Scarlet, MD

## 2023-11-23 NOTE — Patient Instructions (Signed)
Set up physical at some point this year.  

## 2023-11-24 ENCOUNTER — Encounter (HOSPITAL_BASED_OUTPATIENT_CLINIC_OR_DEPARTMENT_OTHER): Payer: Self-pay

## 2023-11-24 NOTE — Telephone Encounter (Signed)
 Can we check on status for pt?

## 2023-12-07 ENCOUNTER — Encounter

## 2023-12-07 DIAGNOSIS — R0683 Snoring: Secondary | ICD-10-CM

## 2023-12-17 ENCOUNTER — Telehealth: Payer: Self-pay | Admitting: Pulmonary Disease

## 2023-12-17 DIAGNOSIS — G4730 Sleep apnea, unspecified: Secondary | ICD-10-CM

## 2023-12-17 DIAGNOSIS — G4733 Obstructive sleep apnea (adult) (pediatric): Secondary | ICD-10-CM | POA: Diagnosis not present

## 2023-12-17 NOTE — Telephone Encounter (Signed)
 Call patient  Sleep study result  Date of study: 12/07/2023  Impression: Severe obstructive sleep apnea with moderate oxygen desaturations AHI of 31.1 with O2 nadir of 80%  Recommendation: DME referral  Recommend CPAP therapy for severe obstructive sleep apnea  Auto titrating CPAP with pressure settings of 5-15 will be appropriate, with heated humidification with patient's mask of choice.  Encourage weight loss measures  Follow-up in the office 4 to 6 weeks following initiation of treatment

## 2023-12-17 NOTE — Telephone Encounter (Signed)
 ATC X1. LMTCB

## 2023-12-21 NOTE — Telephone Encounter (Signed)
 Called patient relayed results,states someone already called him order is placed [facility]

## 2023-12-23 ENCOUNTER — Ambulatory Visit: Payer: Self-pay | Admitting: Adult Health

## 2023-12-23 NOTE — Telephone Encounter (Signed)
 Attempted to call patient. Unable to leave voicemail. MyChart message was sent to scheduled appointment for 2-3 month follow up.

## 2023-12-24 ENCOUNTER — Ambulatory Visit (HOSPITAL_BASED_OUTPATIENT_CLINIC_OR_DEPARTMENT_OTHER): Admitting: Adult Health

## 2024-01-06 ENCOUNTER — Other Ambulatory Visit (HOSPITAL_COMMUNITY): Payer: Self-pay

## 2024-01-13 ENCOUNTER — Ambulatory Visit (HOSPITAL_BASED_OUTPATIENT_CLINIC_OR_DEPARTMENT_OTHER): Admitting: Adult Health

## 2024-02-25 ENCOUNTER — Telehealth: Payer: Self-pay | Admitting: Pulmonary Disease

## 2024-02-25 NOTE — Telephone Encounter (Signed)
 CPAP compliance  Excellent compliance 6 hours 6 minutes AutoSet 5-15 with residual AHI of 10.8

## 2024-03-23 ENCOUNTER — Other Ambulatory Visit: Payer: Self-pay

## 2024-04-11 ENCOUNTER — Other Ambulatory Visit: Payer: Self-pay
